# Patient Record
Sex: Female | Born: 1935 | Race: White | Hispanic: No | Smoking: Never smoker
Health system: Southern US, Community
[De-identification: ages and names within clinical notes are randomized; demographics above are authoritative.]

## PROBLEM LIST (undated history)

## (undated) DIAGNOSIS — E049 Nontoxic goiter, unspecified: Secondary | ICD-10-CM

## (undated) DIAGNOSIS — F0390 Unspecified dementia without behavioral disturbance: Secondary | ICD-10-CM

## (undated) DIAGNOSIS — R011 Cardiac murmur, unspecified: Secondary | ICD-10-CM

## (undated) DIAGNOSIS — E785 Hyperlipidemia, unspecified: Secondary | ICD-10-CM

## (undated) DIAGNOSIS — E039 Hypothyroidism, unspecified: Secondary | ICD-10-CM

## (undated) DIAGNOSIS — R269 Unspecified abnormalities of gait and mobility: Secondary | ICD-10-CM

## (undated) DIAGNOSIS — I4510 Unspecified right bundle-branch block: Secondary | ICD-10-CM

## (undated) DIAGNOSIS — I10 Essential (primary) hypertension: Secondary | ICD-10-CM

## (undated) DIAGNOSIS — I4891 Unspecified atrial fibrillation: Secondary | ICD-10-CM

## (undated) DIAGNOSIS — I499 Cardiac arrhythmia, unspecified: Secondary | ICD-10-CM

## (undated) DIAGNOSIS — F419 Anxiety disorder, unspecified: Secondary | ICD-10-CM

## (undated) DIAGNOSIS — I441 Atrioventricular block, second degree: Secondary | ICD-10-CM

## (undated) HISTORY — PX: ABDOMINAL HYSTERECTOMY: SHX81

## (undated) HISTORY — DX: Nontoxic goiter, unspecified: E04.9

## (undated) HISTORY — DX: Anxiety disorder, unspecified: F41.9

## (undated) HISTORY — DX: Unspecified right bundle-branch block: I45.10

## (undated) HISTORY — DX: Unspecified abnormalities of gait and mobility: R26.9

## (undated) HISTORY — DX: Atrioventricular block, second degree: I44.1

## (undated) HISTORY — PX: PACEMAKER INSERTION: SHX728

## (undated) HISTORY — PX: OTHER SURGICAL HISTORY: SHX169

## (undated) HISTORY — DX: Hyperlipidemia, unspecified: E78.5

## (undated) HISTORY — DX: Essential (primary) hypertension: I10

## (undated) HISTORY — DX: Unspecified atrial fibrillation: I48.91

## (undated) HISTORY — PX: INSERT / REPLACE / REMOVE PACEMAKER: SUR710

## (undated) HISTORY — DX: Cardiac murmur, unspecified: R01.1

---

## 1997-11-17 ENCOUNTER — Other Ambulatory Visit: Admission: RE | Admit: 1997-11-17 | Discharge: 1997-11-17 | Payer: Self-pay | Admitting: Family Medicine

## 2004-06-27 ENCOUNTER — Encounter (HOSPITAL_COMMUNITY): Admission: RE | Admit: 2004-06-27 | Discharge: 2004-09-25 | Payer: Self-pay | Admitting: Endocrinology

## 2005-02-18 ENCOUNTER — Encounter (HOSPITAL_COMMUNITY): Admission: RE | Admit: 2005-02-18 | Discharge: 2005-05-19 | Payer: Self-pay | Admitting: Endocrinology

## 2006-04-23 ENCOUNTER — Encounter: Admission: RE | Admit: 2006-04-23 | Discharge: 2006-04-23 | Payer: Self-pay | Admitting: Endocrinology

## 2006-09-22 ENCOUNTER — Emergency Department (HOSPITAL_COMMUNITY): Admission: EM | Admit: 2006-09-22 | Discharge: 2006-09-22 | Payer: Self-pay | Admitting: Emergency Medicine

## 2007-06-11 ENCOUNTER — Ambulatory Visit (HOSPITAL_COMMUNITY): Admission: RE | Admit: 2007-06-11 | Discharge: 2007-06-12 | Payer: Self-pay | Admitting: Surgery

## 2007-06-11 ENCOUNTER — Encounter (INDEPENDENT_AMBULATORY_CARE_PROVIDER_SITE_OTHER): Payer: Self-pay | Admitting: Surgery

## 2007-06-30 ENCOUNTER — Encounter: Admission: RE | Admit: 2007-06-30 | Discharge: 2007-06-30 | Payer: Self-pay | Admitting: Surgery

## 2008-04-06 ENCOUNTER — Inpatient Hospital Stay (HOSPITAL_COMMUNITY): Admission: EM | Admit: 2008-04-06 | Discharge: 2008-04-07 | Payer: Self-pay | Admitting: Emergency Medicine

## 2008-05-12 DIAGNOSIS — I441 Atrioventricular block, second degree: Secondary | ICD-10-CM

## 2008-05-12 HISTORY — DX: Atrioventricular block, second degree: I44.1

## 2008-05-23 ENCOUNTER — Encounter: Admission: RE | Admit: 2008-05-23 | Discharge: 2008-05-23 | Payer: Self-pay | Admitting: Cardiology

## 2008-05-25 ENCOUNTER — Inpatient Hospital Stay (HOSPITAL_COMMUNITY): Admission: AD | Admit: 2008-05-25 | Discharge: 2008-05-28 | Payer: Self-pay | Admitting: Cardiology

## 2008-06-13 ENCOUNTER — Emergency Department (HOSPITAL_COMMUNITY): Admission: EM | Admit: 2008-06-13 | Discharge: 2008-06-13 | Payer: Self-pay | Admitting: Emergency Medicine

## 2008-08-04 ENCOUNTER — Encounter: Admission: RE | Admit: 2008-08-04 | Discharge: 2008-11-02 | Payer: Self-pay | Admitting: Neurology

## 2008-11-25 IMAGING — CT CT CHEST W/ CM
2 of 4 series · 15 of 36 positions shown, 18 images · IV contrast (agent unspecified)
Comparison: [REDACTED] chest x-ray 06/11/2007

CLINICAL DATA: Findings suspicious for healing right rib fractures
Thyroid goiter postop.

CT CHEST WITH CONTRAST
TECHNIQUE: Multidetector CT imaging of the chest was performed
following the standard protocol during bolus administration of
intravenous contrast.
Contrast: 75 ml Kmnipaque-RXX.

[Series 3: routine chest · axial · 0.70mm/px · z∈[-250,-6]mm · 12 of 59 slices shown, 15 images]
[im 5/59  mediastinal]
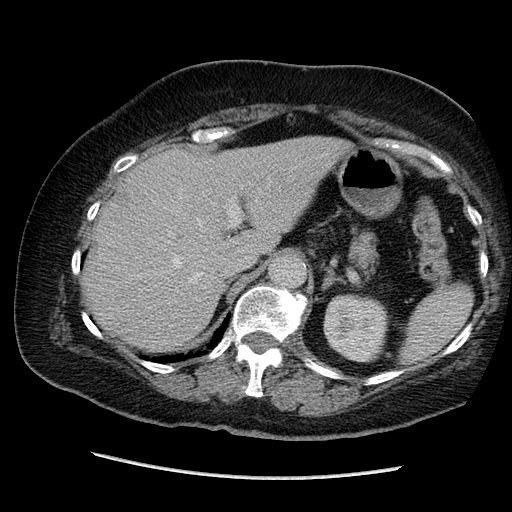
[im 5/59  lung]
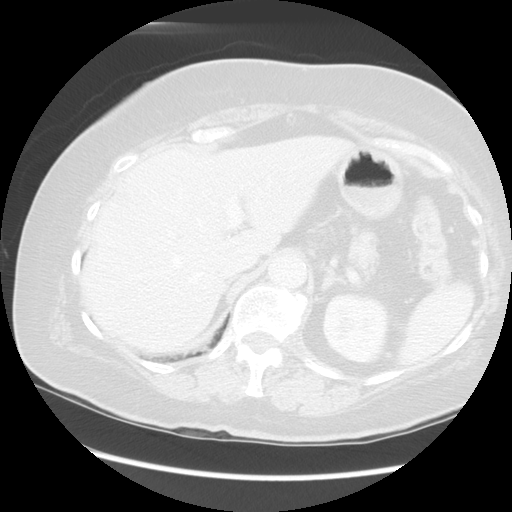
[im 9/59  lung]
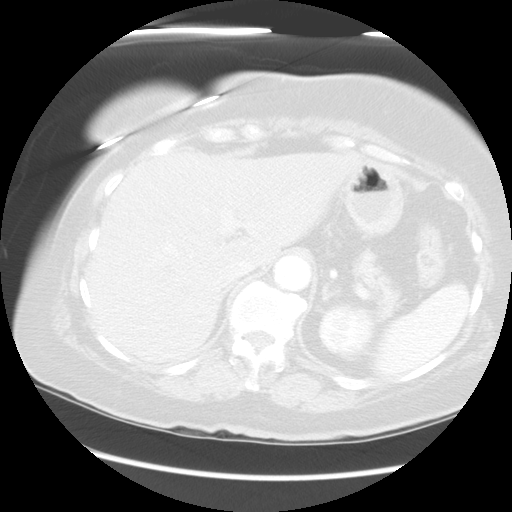
[im 13/59  lung]
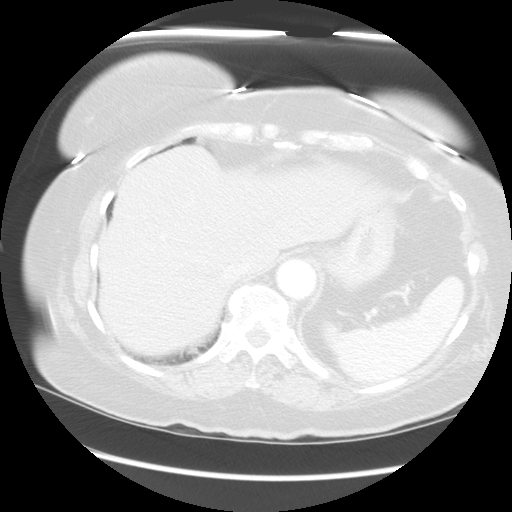
[im 17/59  lung]
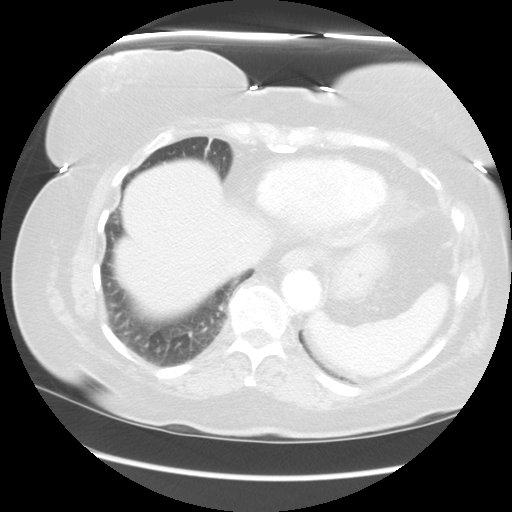
[im 21/59  mediastinal]
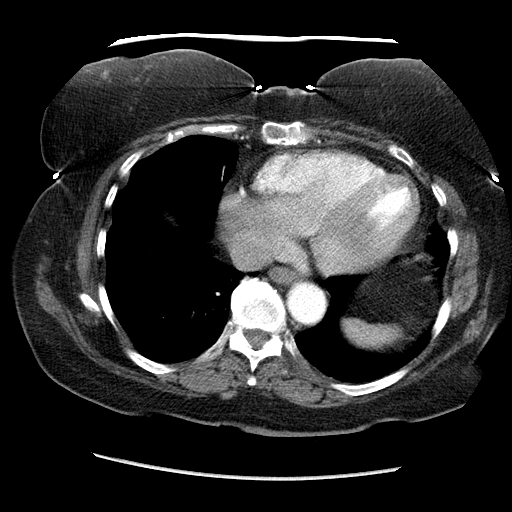
[im 21/59  lung]
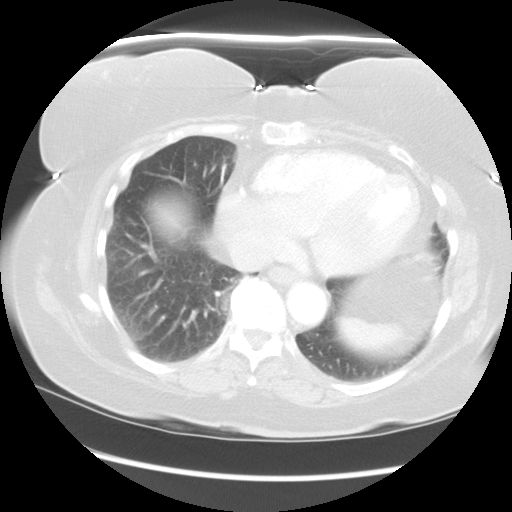
[im 25/59  lung]
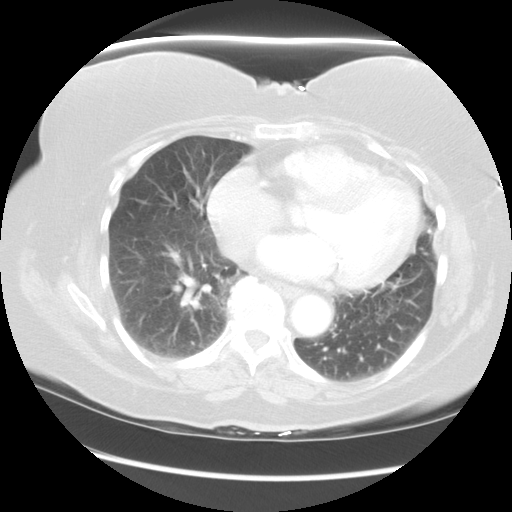
[im 34/59  lung]
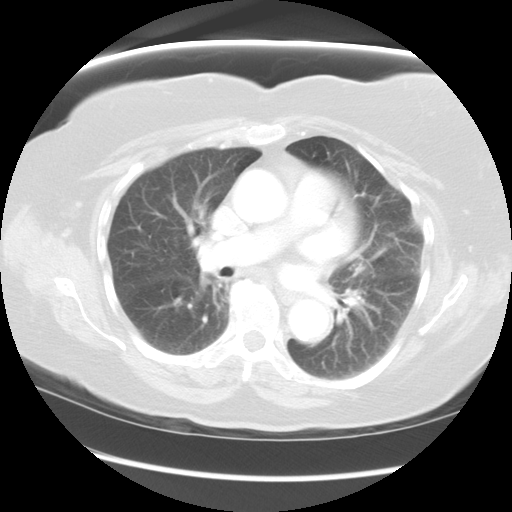
[im 38/59  lung]
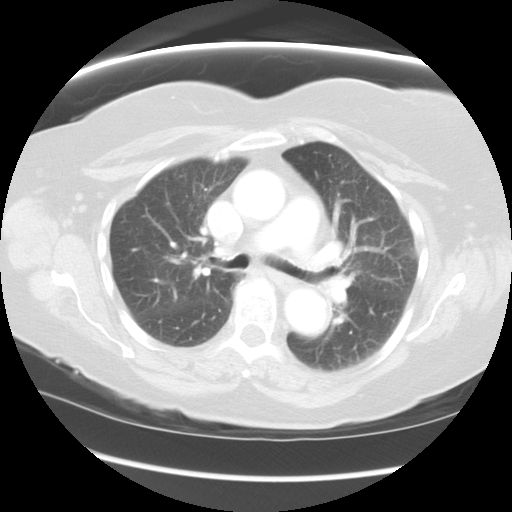
[im 42/59  mediastinal]
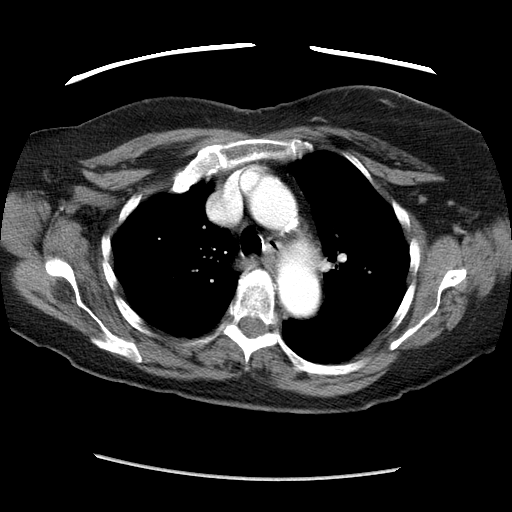
[im 42/59  lung]
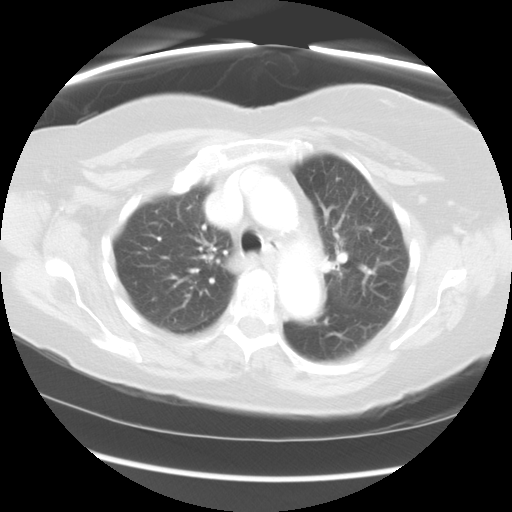
[im 46/59  lung]
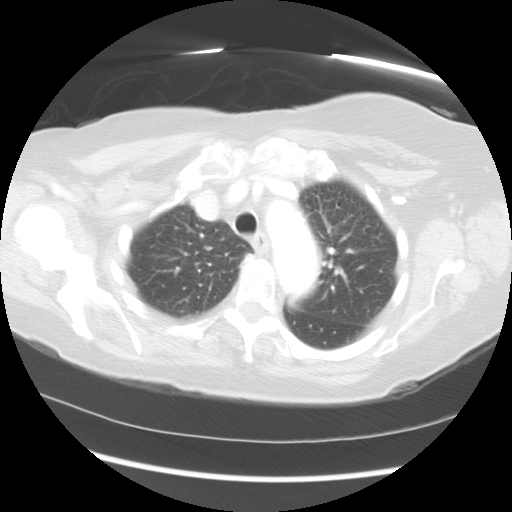
[im 50/59  lung]
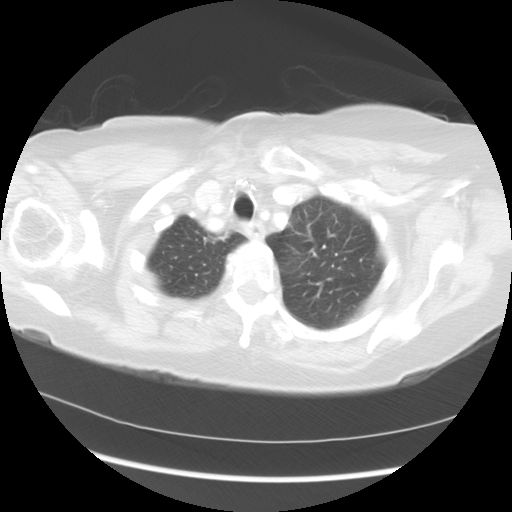
[im 54/59  lung]
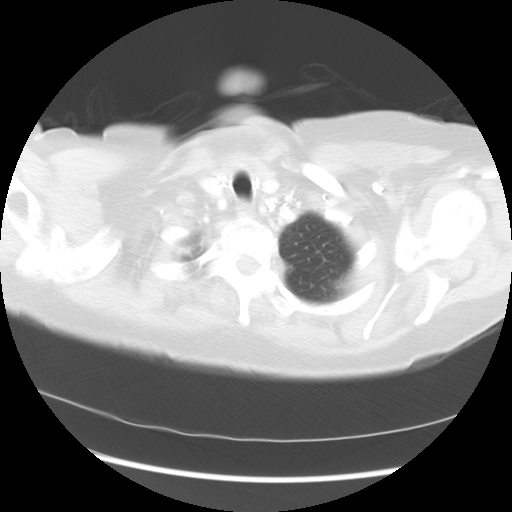

[Series 602: sagittal body · sagittal · 0.70mm/px · 3 of 145 slices shown]
[im 29/145  lung]
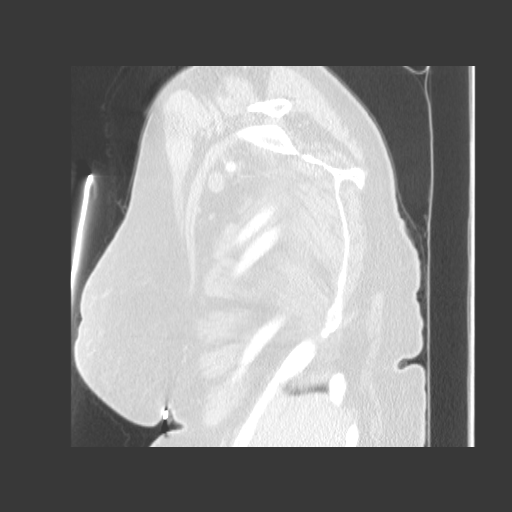
[im 58/145  lung]
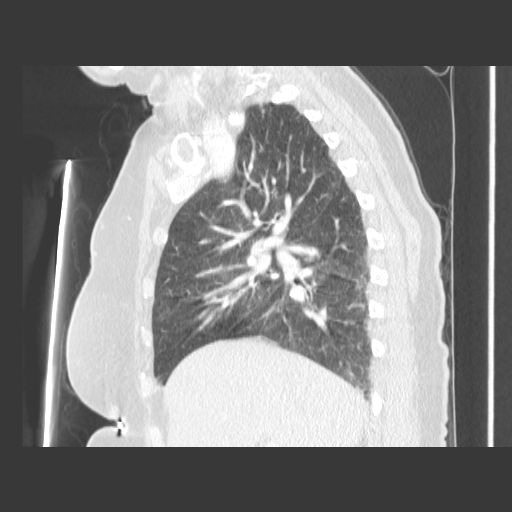
[im 87/145  lung]
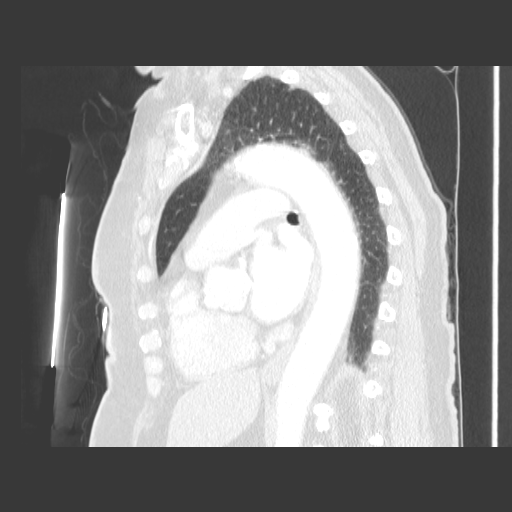

[15 of 36 positions shown; findings below may reference images not displayed]

FINDINGS: Region of chest x-ray findings correspond to benign
appearing subacute or old right anterolateral third through fifth
rib fracture changes.  No pathologic bone lesion is seen by CT.
The patient is post thyroid resection with slight generalized
atheromatous vascular calcification to include coronary arteries.
Stable slight cardiomegaly is seen.  No mediastinal, hilar 1or
axillary mass/adenopathy is seen.  Lungs appear clear with no
pleural abnormality.  Stable slight to moderate degenerative
changes are seen within the thoracic spine and right
acromioclavicular joint.
IMPRESSION: 1.  Chest x-ray findings correspond to subacute to chronic right
anterolateral third through fifth rib fractures with no pathologic
lesions seen.
2.  Slight atheromatous vascular calcification to include coronary
arteries with stable slight cardiomegaly.
3.  Otherwise, no acute findings.

## 2008-11-29 ENCOUNTER — Encounter: Payer: Self-pay | Admitting: Emergency Medicine

## 2008-11-29 ENCOUNTER — Ambulatory Visit: Admission: RE | Admit: 2008-11-29 | Discharge: 2008-11-29 | Payer: Self-pay | Admitting: Emergency Medicine

## 2008-11-29 ENCOUNTER — Ambulatory Visit: Payer: Self-pay | Admitting: Vascular Surgery

## 2009-04-10 ENCOUNTER — Encounter: Admission: RE | Admit: 2009-04-10 | Discharge: 2009-04-10 | Payer: Self-pay | Admitting: Emergency Medicine

## 2009-12-21 ENCOUNTER — Encounter
Admission: RE | Admit: 2009-12-21 | Discharge: 2010-01-29 | Payer: Self-pay | Source: Home / Self Care | Attending: Endocrinology | Admitting: Endocrinology

## 2010-03-03 ENCOUNTER — Encounter: Payer: Self-pay | Admitting: Endocrinology

## 2010-03-04 ENCOUNTER — Encounter: Payer: Self-pay | Admitting: Endocrinology

## 2010-03-04 ENCOUNTER — Encounter: Payer: Self-pay | Admitting: Emergency Medicine

## 2010-03-05 ENCOUNTER — Encounter: Payer: Self-pay | Admitting: Surgery

## 2010-03-05 ENCOUNTER — Encounter: Payer: Self-pay | Admitting: Emergency Medicine

## 2010-05-22 LAB — POCT I-STAT, CHEM 8
Creatinine, Ser: 0.8 mg/dL (ref 0.4–1.2)
Hemoglobin: 13.9 g/dL (ref 12.0–15.0)
Sodium: 140 mEq/L (ref 135–145)
TCO2: 29 mmol/L (ref 0–100)

## 2010-05-22 LAB — URINALYSIS, ROUTINE W REFLEX MICROSCOPIC
Glucose, UA: NEGATIVE mg/dL
Hgb urine dipstick: NEGATIVE
Specific Gravity, Urine: 1.021 (ref 1.005–1.030)

## 2010-05-22 LAB — URINE MICROSCOPIC-ADD ON

## 2010-05-23 LAB — BASIC METABOLIC PANEL
CO2: 31 mEq/L (ref 19–32)
Calcium: 8.5 mg/dL (ref 8.4–10.5)
Calcium: 9.2 mg/dL (ref 8.4–10.5)
Creatinine, Ser: 0.78 mg/dL (ref 0.4–1.2)
GFR calc Af Amer: >60 mL/min (ref >60)
GFR calc Af Amer: >60 mL/min (ref >60)
GFR calc non Af Amer: >60 mL/min (ref >60)
GFR calc non Af Amer: >60 mL/min (ref >60)
Glucose, Bld: 97 mg/dL (ref 70–99)
Glucose, Bld: 119 mg/dL — ABNORMAL HIGH (ref 70–99)
Potassium: 3.8 mEq/L (ref 3.5–5.1)
Sodium: 137 mEq/L (ref 135–145)
Sodium: 141 mEq/L (ref 135–145)

## 2010-05-23 LAB — CBC
HCT: 41 % (ref 36.0–46.0)
Hemoglobin: 12.4 g/dL (ref 12.0–15.0)
Hemoglobin: 13.9 g/dL (ref 12.0–15.0)
MCHC: 33.8 g/dL (ref 30.0–36.0)
RBC: 4.43 MIL/uL (ref 3.87–5.11)
RDW: 13.7 % (ref 11.5–15.5)
RDW: 13.8 % (ref 11.5–15.5)

## 2010-05-23 LAB — LIPID PANEL
Cholesterol: 194 mg/dL (ref 0–200)
HDL: 54 mg/dL (ref >39)
LDL Cholesterol: 112 mg/dL — ABNORMAL HIGH (ref 0–99)
Total CHOL/HDL Ratio: 3.6 RATIO

## 2010-05-23 LAB — MAGNESIUM
Magnesium: 1.9 mg/dL (ref 1.5–2.5)
Magnesium: 2 mg/dL (ref 1.5–2.5)

## 2010-05-23 LAB — PROTIME-INR
INR: 1.6 — ABNORMAL HIGH (ref 0.00–1.49)
Prothrombin Time: 20.2 seconds — ABNORMAL HIGH (ref 11.6–15.2)

## 2010-05-23 LAB — BRAIN NATRIURETIC PEPTIDE: Pro B Natriuretic peptide (BNP): 425 pg/mL — ABNORMAL HIGH (ref 0.0–100.0)

## 2010-05-23 LAB — CARDIAC PANEL(CRET KIN+CKTOT+MB+TROPI)
Relative Index: 2.2 (ref 0.0–2.5)
Relative Index: 2.4 (ref 0.0–2.5)
Troponin I: 0.03 ng/mL (ref 0.00–0.06)

## 2010-05-23 LAB — COMPREHENSIVE METABOLIC PANEL
Alkaline Phosphatase: 66 U/L (ref 39–117)
BUN: 13 mg/dL (ref 6–23)
CO2: 27 mEq/L (ref 19–32)
Chloride: 105 mEq/L (ref 96–112)
Creatinine, Ser: 0.55 mg/dL (ref 0.4–1.2)
GFR calc non Af Amer: >60 mL/min (ref >60)
Glucose, Bld: 96 mg/dL (ref 70–99)
Total Bilirubin: 0.9 mg/dL (ref 0.3–1.2)

## 2010-05-29 LAB — COMPREHENSIVE METABOLIC PANEL
AST: 26 U/L (ref 0–37)
Albumin: 3.5 g/dL (ref 3.5–5.2)
BUN: 17 mg/dL (ref 6–23)
Calcium: 9 mg/dL (ref 8.4–10.5)
Chloride: 105 mEq/L (ref 96–112)
Creatinine, Ser: 0.81 mg/dL (ref 0.4–1.2)
GFR calc Af Amer: >60 mL/min (ref >60)
Total Bilirubin: 0.7 mg/dL (ref 0.3–1.2)
Total Protein: 6.7 g/dL (ref 6.0–8.3)

## 2010-05-29 LAB — CARDIAC PANEL(CRET KIN+CKTOT+MB+TROPI)
CK, MB: 3.2 ng/mL (ref 0.3–4.0)
CK, MB: 3.3 ng/mL (ref 0.3–4.0)
Relative Index: 2.6 — ABNORMAL HIGH (ref 0.0–2.5)
Total CK: 121 U/L (ref 7–177)

## 2010-05-29 LAB — PROTIME-INR
INR: 1 (ref 0.00–1.49)
Prothrombin Time: 13.5 seconds (ref 11.6–15.2)

## 2010-05-29 LAB — CBC
Hemoglobin: 14.1 g/dL (ref 12.0–15.0)
MCHC: 34.4 g/dL (ref 30.0–36.0)
MCV: 93.4 fL (ref 78.0–100.0)
RBC: 4.38 MIL/uL (ref 3.87–5.11)
RDW: 13.4 % (ref 11.5–15.5)

## 2010-05-29 LAB — TROPONIN I: Troponin I: 0.02 ng/mL (ref 0.00–0.06)

## 2010-05-29 LAB — T4, FREE: Free T4: 1.57 ng/dL (ref 0.89–1.80)

## 2010-05-29 LAB — APTT: aPTT: 31 seconds (ref 24–37)

## 2010-05-29 LAB — CK TOTAL AND CKMB (NOT AT ARMC): CK, MB: 4.1 ng/mL — ABNORMAL HIGH (ref 0.3–4.0)

## 2010-05-29 LAB — TSH: TSH: 0.007 u[IU]/mL — ABNORMAL LOW (ref 0.350–4.500)

## 2010-06-26 NOTE — Op Note (Signed)
[name], [name]               ACCOUNT NO.:  [id number]   MEDICAL RECORD NO.:  [id number]          PATIENT TYPE:  AMB   LOCATION:  SDS                          FACILITY:  [facility]   PHYSICIAN:  Velora Heckler, MD      DATE OF BIRTH:  February 26, 1926   DATE OF PROCEDURE:  06/11/2007  DATE OF DISCHARGE:                               OPERATIVE REPORT   PREOPERATIVE DIAGNOSIS:  Multinodular goiter with compressive symptoms.   POSTOPERATIVE DIAGNOSIS:  Multinodular goiter with compressive symptoms.   PROCEDURE:  Completion thyroidectomy.   SURGEON:  Velora Heckler, MD, FACS   ASSISTANT:  Angelia Mould. Derrell Lolling, MD, FACS   ANESTHESIA:  General per Dr. Diamantina Monks.   ESTIMATED BLOOD LOSS:  Minimal.   PREPARATION:  Betadine.   COMPLICATIONS:  None.   INDICATIONS:  The patient is a 77 year old white female from [place],  [place].  She was referred by Dr. Adrian Prince with thyroid  goiter.  The patient has a history of partial thyroidectomy performed in  1976 in [place], [place] for toxic nodule.  The patient has  borderline hyperthyroidism.  The patient was evaluated by Dr. Armanda Magic and cleared for surgery from a cardiac standpoint.  She is now  prepared and brought to the operating room for completion thyroidectomy.   BODY OF REPORT:  Procedure was done in OR #60 at [facility].  The patient was brought to the operating room and placed in a  supine position on the operating room table.  Following administration  of general anesthesia, the patient was positioned and then prepped and  draped in usual strict aseptic fashion.  After ascertaining that an  adequate level of anesthesia been achieved, the previous Kocher incision  was reopened with a #10 blade.  Dissection was carried through  subcutaneous tissues and platysma.  Hemostasis was obtained with  electrocautery.  Skin flaps were elevated cephalad and caudad from the  thyroid notch to the  sternal notch.  A Mahorner self-retaining retractor  was placed for exposure.  Strap muscles were incised in the midline.  Dissection was begun on the left.  Left thyroid lobe was moderately  prominent.  It appears that it contain a single large nodule.  It was  gently dissected out.  Tissue planes were easily developed.  It appears  that previous thyroid surgery has involved nodules within the thyroid  isthmus.  Strap muscles were reflected laterally.  The left lobe was  mobilized gently with a Pension scheme manager.  Superior pole vessels were  dissected out and ligated in continuity with 2-0 silk ties and divided  with a harmonic scalpel.  Parathyroid tissue was identified and  preserved at the superior pole.  Inferior venous tributaries were  divided between Ligaclips with the harmonic scalpel.  Gland was  mobilized and rolled anteriorly.  Recurrent nerve was identified and  preserved.  Branches of the inferior thyroid artery were divided between  small Ligaclips with a harmonic scalpel.  Gland was rolled up and onto  the anterior trachea.  Ligament of Allyson Sabal  was transected with the  electrocautery, and the lobe was mobilized across the midline.  There  was essentially no tissue in the thyroid isthmus.  There was no sign of  pyramidal lobe.   Next, we turned our attention to the right thyroid lobe.  Again, strap  muscles were reflected laterally.  The right lobe was markedly larger  than the left.  It contains at least a single large dominant nodule.  Strap muscles were reflected laterally and lobe was gently dissected out  with the Pension scheme manager.  Again, no evidence of previous resection  was noted in the lateral right lobe.  Middle vein was divided between  medium Ligaclips with the harmonic scalpel.  Superior pole was dissected  out.  Superior pole vessels were ligated in continuity with 2-0 silk  ties and divided with the harmonic scalpel.  Tributaries to the superior  pole were  also divided between Ligaclips with the harmonic scalpel.  Parathyroid tissue was identified and preserved.  Inferior parathyroid  was identified on the right and preserved.  Inferior venous tributaries  were divided between medium Ligaclips with the harmonic scalpel.  Gland  was rolled further anteriorly.  Recurrent nerve was identified and  preserved.  Branches of the inferior thyroid artery were divided between  small Ligaclips with the harmonic scalpel.  Ligament of Allyson Sabal was  transected with electrocautery and the gland was excised off the  anterior trachea.  Sutures used to mark the right superior pole.  The  entire specimen was submitted to pathology for review.  Neck was  irrigated bilaterally with warm saline.  Hemostasis was achieved with  small Ligaclips.  Surgicel was placed in the operative field.  Strap  muscles were reapproximated in the midline with interrupted 3-0 Vicryl  sutures.  Platysma was closed with interrupted 3-0 Vicryl sutures.  Skin  was closed with running 4-0 Monocryl subcuticular suture.  Wound was  washed and dried.  Benzoin and Steri-Strips were applied.  Sterile  dressings were applied.  The patient was awakened from anesthesia and  brought to the recovery room in stable condition.  The patient tolerated  the procedure well.      Velora Heckler, MD  Electronically Signed     TMG/MEDQ  D:  06/11/2007  T:  06/12/2007  Job:  [id number]   cc:   Jeannett Senior A. Evlyn Kanner, M.D.  [place] Cleta Alberts, M.D.  Armanda Magic, M.D.

## 2010-06-26 NOTE — H&P (Signed)
NAMEMABELL, ESGUERRA NO.:  [id number]   MEDICAL RECORD NO.:  [id number]          PATIENT TYPE:  INP   LOCATION:  4707                         FACILITY:  [facility]   PHYSICIAN:  Lyn Records, M.D.   DATE OF BIRTH:  1926-05-30   DATE OF ADMISSION:  04/06/2008  DATE OF DISCHARGE:                              HISTORY & PHYSICAL   CHIEF COMPLAINT:  Atrial fibrillation.   HISTORY OF PRESENT ILLNESS:  [name] is a 77 year old female with a  known history of paroxysmal atrial fibrillation, but is not a Coumadin  candidate secondary to gait disorder and questionable dementia.  She  went to see Dr. Cleta Alberts today for medication refills and was found to be in  AFib with RVR.  She was sent to [facility] for admission.  Interestingly, the patient is asymptomatic.  She denies any chest pain,  palpitations, PND, orthopnea, dizziness, or syncope.   REVIEW OF SYSTEMS:  Otherwise negative.   PAST MEDICAL HISTORY:  She has undergone a thyroidectomy secondary to  goiter, history of hypercholesterolemia, hypertension, right bundle-  branch block, and paroxysmal atrial fibrillation.  She has a fall risk.  She has a history of gait disorder and sees Dr. Vickey Huger.  She has a  questionable history of dementia as well as anxiety.   SOCIAL HISTORY:  She denies any tobacco, alcohol, or illicit drug use.   FAMILY HISTORY:  Father had a history of heart disease.   ALLERGIES:  No known drug allergies.   MEDICATIONS:  1. Synthroid 75 mcg 2 tablets daily.  2. Lasix p.r.n.  3. Potassium 20 mEq 1 p.o. b.i.d.  4. Xanax 0.5 mg 1/2 tablet 1 tablet p.o. b.i.d.  5. Vitamin D.   I have called her pharmacy, [facility] on [address] [phone] and spoke to  the pharmacist.  He states that her last dispensed lisinopril 20 mg a  day and Toprol-XL 25 mg a day was at the end of December and since that  time she has not picked any more of these.  I am concerned that she has  been noncompliant with these  medications and that this is why she has  reverted back into atrial fibrillation.   PHYSICAL EXAMINATION:  VITAL SIGNS:  She is afebrile, pulse is now 85,  respirations 20, and blood pressure 130/70.  GENERAL:  She is anxious.  HEENT: Grossly normal.  No carotid or subclavian bruits.  No JVD or  thyromegaly.  Sclerae clear.  Conjunctivae normal.  Nares without  drainage.  CHEST:  Clear to auscultation bilaterally.  No wheeze or rhonchi.  HEART:  Irregular.  No gross murmur.  ABDOMEN:  Good bowel sounds, nontender, nondistended.  No mass.  No  bruits.  LOWER EXTREMITIES:  No peripheral edema.  SKIN:  Warm and dry.  NEUROLOGIC:  Cranial nerves II-XII grossly intact.  She is anxious.   Chest x-ray pending.  Labs are pending.   Interestingly, since my exam, she has converted back to normal sinus  rhythm.  Her EKG shows normal sinus rhythm rate 85 with a right bundle-  branch block, unchanged.   ASSESSMENT AND PLAN:  1. Atrial fibrillation/flutter, now normal sinus rhythm.  The patient      has been on Toprol in the past, but over the past month, has not      picked up refill of this medication, perhaps this is why she was      going to see Dr. Cleta Alberts today.  I suspect she has been off the Toprol      as well as her lisinopril and this is attributed to her recurrence      of atrial fibrillation.  2. Hypercholesterolemia.  3. Hypertension.  4. Right bundle-branch block.  5. Paroxysmal atrial fibrillation.  6. Question of compliance.  7. Questionable dementia.   We will restart her beta blocker, check her electrolytes, check her  thyroid level, and check her cardiac isoenzymes.  Keep her in the  hospital overnight, and if she is remaining in sinus rhythm, we will  discharge her to home in the morning.       [name], P.A.      Lyn Records, M.D.  Electronically Signed    LB/MEDQ  D:  04/06/2008  T:  04/07/2008  Job:  [id number]   cc:   Armanda Magic, M.D.  Tera Mater.  Evlyn Kanner, M.D.  [place] Cleta Alberts, M.D.

## 2010-06-26 NOTE — Discharge Summary (Signed)
NAMEAYSSA, BENTIVEGNA               ACCOUNT NO.:  [id number]   MEDICAL RECORD NO.:  [id number]          PATIENT TYPE:  INP   LOCATION:  4707                         FACILITY:  [facility]   PHYSICIAN:  Armanda Magic, M.D.     DATE OF BIRTH:  Jun 23, 1926   DATE OF ADMISSION:  04/06/2008  DATE OF DISCHARGE:  04/07/2008                               DISCHARGE SUMMARY   DISCHARGE DIAGNOSES:  1. Paroxysmal atrial fibrillation now normal sinus rhythm.  2. Gait disorder.  3. Early dementia.  4. Status post thyroidectomy secondary to goiter, hyperthyroid now      clinically on replacement therapy.  5. Hypercholesterolemia.  6. Hypertension.  7. Right bundle branch block.  8. Fall risk.  9. Early dementia.  10.Anxiety.   HOSPITAL COURSE:  [name] is a 77 year old female who has known  paroxysmal atrial fibrillation.  She went to see Dr. Cleta Alberts for some  medication refills and was found to be in AFib with RVR.  Interestingly, the patient was asymptomatic.   She was sent over to [facility] for admission and evaluation.  While  in the emergency room, she converted back to normal sinus rhythm.  In  reviewing her medications, she was confused on her medications so I  called her pharmacy and discovered that she has last picked up her  lisinopril 20 mg a day and Toprol-XL 25 mg a day at the end of December  so from my calculation she has not had these medications for at least 2-  3 weeks.  I suspect this is why she went back into her atrial  fibrillation.  The patient admitted to me that she had ran out of some  of her medications.   We restarted her beta blocker.  I did not restart her lisinopril at this  time just would like to see what her blood pressure would do with just  being on metoprolol.   Lab studies showed cardiac enzymes negative x2.  TSH 0.007, but her free  T4 and T3 were normal.  Her white count was 8.1, hemoglobin 14.1,  hematocrit 40.9, platelets 183.  Sodium 139, potassium  4.8, BUN 17,  creatinine 0.81.   DISCHARGE MEDICATIONS:  1. Synthroid 75 mcg 2 tablets daily as before.  2. Lasix p.r.n.  3. Potassium 20 mEq twice a day as before.  4. Xanax twice a day as needed.  5. Vitamin D daily.  6. Toprol-XL 25 mg a day.   I have asked her to hold her lisinopril for now until we see what  happens with her blood pressure on restarting her Toprol.  We may need  to restart the lisinopril at the next office visit.   She is discharged home in stable but improved condition.  She is to  remain on a low-sodium heart-healthy diet.  Increase activity slowly.  Follow up with Dr. Mayford Knife on April 25, 2008 at 4:15 p.m.      Guy Franco, P.A.      Armanda Magic, M.D.  Electronically Signed    LB/MEDQ  D:  04/07/2008  T:  04/07/2008  Job:  [id number]   cc:   Tera Mater. Evlyn Kanner, M.D.  [place] Cleta Alberts, M.D.

## 2010-06-26 NOTE — Discharge Summary (Signed)
[name], [name]               ACCOUNT NO.:  [id number]   MEDICAL RECORD NO.:  [id number]          PATIENT TYPE:  INP   LOCATION:  4707                         FACILITY:  [facility]   PHYSICIAN:  Armanda Magic, M.D.     DATE OF BIRTH:  November 11, 1926   DATE OF ADMISSION:  04/06/2008  DATE OF DISCHARGE:  04/07/2008                               DISCHARGE SUMMARY   DISCHARGE DIAGNOSES:  1. Paroxysmal atrial fibrillation now in normal sinus rhythm.  2. Hypercholesterolemia.  3. Hypertension.  4. Right bundle branch block.  5. Questionable compliance.  6. Early dementia.   HOSPITAL COURSE:  [name] is a 77 year old female who was going to see  Dr. Cleta Strickland at the [facility] for routine refills of her  medications.  At that time, she was found to be in atrial fibrillation  with RVR.  She was sent to [facility] for admission.  Interestingly,  she was asymptomatic.   In further discussions with her, it did not seem clear to me that she  knew her medications so I called her pharmacist.  We figured it out that  her last refills of lisinopril was late December 2009 as well as Toprol-  XL 25 mg a day.  In further discussion with the patient, she was going  to see Dr. Cleta Strickland for refills and we think this was for these medications.  Therefore, she has not been on Toprol for at least several weeks it  appears.   While in the emergency room before we started any type of rate  controlling medications, she converted back to normal sinus rhythm.  We  watched her overnight.  We restarted her beta blocker.   Her labs were essentially normal.  Her cardiac enzymes were negative x2.  Her TSH was 0.007 but her free T4 was 1.57 with a T3 of 3.6.  She tells  me she had just seen Dr. Evlyn Strickland and had these checked 2 weeks ago.  Other  lab work included sodium 139, potassium 4.8, BUN 17, creatinine 0.81,  glucose 91, hemoglobin 14.1, hematocrit 40.9, platelets 183, and white  count 8.1.   She is  being discharged to home in stable but improved condition.  She  is to remain on a low-sodium heart-healthy diet.  Increase activity  slowly.  Follow up with Dr. Mayford Knife on April 25, 2008 at 4:15 p.m.   At this point, I have asked her not to restart her lisinopril just so we  can see what her blood pressure does on the following medications.  1. Synthroid 75 mcg 2 tablets daily.  2. Lasix p.r.n.  3. Potassium 20 mEq one p.o. b.i.d. as before.  4. Xanax twice a day p.r.n.  5. Vitamin D daily.  6. Toprol-XL 25 mg a day.   If her blood pressure is elevated at the next appointment, we will need  to restart her lisinopril but again since she has been off of it for so  long I am afraid to restart both medications at the same time.      Guy Franco, P.A.  Armanda Magic, M.D.  Electronically Signed    LB/MEDQ  D:  04/07/2008  T:  04/07/2008  Job:  [id number]   cc:   [name], M.D.  [name] [name], M.D.

## 2010-06-26 NOTE — Discharge Summary (Signed)
NAMEFANY, CAVANAUGH NO.:  [id number]   MEDICAL RECORD NO.:  [id number]          PATIENT TYPE:  INP   LOCATION:  2508                         FACILITY:  [facility]   PHYSICIAN:  Armanda Magic, M.D.     DATE OF BIRTH:  [birth year]   DATE OF ADMISSION:  05/25/2008  DATE OF DISCHARGE:  05/28/2008                               DISCHARGE SUMMARY   DISCHARGE DIAGNOSES:  1. Tachy-brady syndrome status post dual-chamber pacemaker, [employer],      on May 27, 2008, by Dr. Corliss Marcus.  2. Hypertension.  3. Right bundle-branch block.  4. Paroxysmal atrial fibrillation.  5. Gait disorder.  6. Anxiety.  7. History of dementia.   HOSPITAL COURSE:  Ms. Kreiser is a 77 year old female who has a history of  PAF with RVR.  She was recently placed on the heart monitor, and the  monitor revealed a couple of episodes of second-degree heart block.  She  had some nausea and vomiting with this.  She does have a dementia  however and therefore, is a poor historian.  She does complain of  feeling weak all over.  She was on Coumadin previously but due to her  history of dementia and recent fall, this was stopped.   She was seen in the office due to the findings on the heart monitor and  admitted for tachy-brady syndrome.  She received a [employer] Jude permanent  pacemaker on May 27, 2008, under the care of Dr. Corliss Marcus.  By  May 28, 2008, the patient was ready for discharge to home.   LABORATORY STUDIES:  White count of 8.6, hemoglobin 12.4, hematocrit of  36.2, platelets 213.  Sodium 137, potassium 3.9, BUN 21, creatinine  0.78.  Of note, her TSH was 0.004.  This will need to be followed up on  as an outpatient.   She is to remain on a low-sodium heart-healthy diet.  Activity and wound  care are as per regular instruction sheet.  She is to return to see Dr.  Deitra Mayo, nurse practitioner, on June 07, 2008, at 2:20 p.m.  She is to stop her lisinopril.  Continue potassium 20  mEq twice a day,  Lasix p.r.n., Synthroid 75 mcg p.o. a day, Xanax p.r.n., vitamin D  daily, Toprol-XL 50 mg a day.     Guy Franco, P.A.      Armanda Magic, M.D.  Electronically Signed   LB/MEDQ  D:  08/09/2008  T:  08/10/2008  Job:  [id number]

## 2010-08-02 ENCOUNTER — Other Ambulatory Visit: Payer: Self-pay | Admitting: Neurology

## 2010-08-02 DIAGNOSIS — R269 Unspecified abnormalities of gait and mobility: Secondary | ICD-10-CM

## 2010-08-02 DIAGNOSIS — N3941 Urge incontinence: Secondary | ICD-10-CM

## 2010-08-02 DIAGNOSIS — F0390 Unspecified dementia without behavioral disturbance: Secondary | ICD-10-CM

## 2010-08-03 ENCOUNTER — Ambulatory Visit
Admission: RE | Admit: 2010-08-03 | Discharge: 2010-08-03 | Disposition: A | Discharge status: Home / Self Care | Payer: PRIVATE HEALTH INSURANCE | Source: Ambulatory Visit | Attending: Neurology | Admitting: Neurology

## 2010-08-03 DIAGNOSIS — R269 Unspecified abnormalities of gait and mobility: Secondary | ICD-10-CM

## 2010-08-03 DIAGNOSIS — F0390 Unspecified dementia without behavioral disturbance: Secondary | ICD-10-CM

## 2010-08-03 DIAGNOSIS — N3941 Urge incontinence: Secondary | ICD-10-CM

## 2010-11-06 LAB — CBC
HCT: 40.2
Hemoglobin: 13.7
RDW: 13.9
WBC: 8.3

## 2010-11-06 LAB — URINALYSIS, ROUTINE W REFLEX MICROSCOPIC
Glucose, UA: NEGATIVE
Protein, ur: NEGATIVE
Specific Gravity, Urine: 1.021
Urobilinogen, UA: 0.2

## 2010-11-06 LAB — CALCIUM: Calcium: 8.7

## 2010-11-06 LAB — BASIC METABOLIC PANEL
Calcium: 9.8
GFR calc Af Amer: >60
GFR calc non Af Amer: >60
Glucose, Bld: 91
Potassium: 4.5
Sodium: 143

## 2010-11-06 LAB — DIFFERENTIAL
Basophils Absolute: 0
Eosinophils Relative: 2
Lymphocytes Relative: 38
Lymphs Abs: 3.1
Monocytes Absolute: 0.6
Neutro Abs: 4.3

## 2010-11-06 LAB — PROTIME-INR: Prothrombin Time: 12.8

## 2010-11-26 LAB — COMPREHENSIVE METABOLIC PANEL
ALT: 23
AST: 27
Albumin: 3.6
Alkaline Phosphatase: 50
Chloride: 101
GFR calc Af Amer: >60
Potassium: 3.8
Sodium: 136
Total Protein: 6.7

## 2010-11-26 LAB — URINALYSIS, ROUTINE W REFLEX MICROSCOPIC
Hgb urine dipstick: NEGATIVE
Specific Gravity, Urine: 1.021
Urobilinogen, UA: 0.2
pH: 5.5

## 2010-11-26 LAB — CBC
Platelets: 207
RDW: 12.9
WBC: 7.5

## 2010-11-26 LAB — POCT CARDIAC MARKERS: CKMB, poc: 3.5

## 2010-11-26 LAB — DIFFERENTIAL
Basophils Relative: 1
Eosinophils Absolute: 0.1
Eosinophils Relative: 2
Monocytes Absolute: 0.7
Monocytes Relative: 9
Neutro Abs: 4.9

## 2011-01-23 ENCOUNTER — Emergency Department (HOSPITAL_COMMUNITY): Payer: Medicare Other

## 2011-01-23 ENCOUNTER — Other Ambulatory Visit: Payer: Self-pay

## 2011-01-23 ENCOUNTER — Encounter: Payer: Self-pay | Admitting: *Deleted

## 2011-01-23 ENCOUNTER — Inpatient Hospital Stay (HOSPITAL_COMMUNITY)
Admission: EM | Admit: 2011-01-23 | Discharge: 2011-01-26 | DRG: 312 | Disposition: A | Discharge status: Home / Self Care | Payer: Medicare Other | Attending: Cardiology | Admitting: Cardiology

## 2011-01-23 DIAGNOSIS — R55 Syncope and collapse: Principal | ICD-10-CM | POA: Diagnosis present

## 2011-01-23 DIAGNOSIS — I4891 Unspecified atrial fibrillation: Secondary | ICD-10-CM | POA: Diagnosis present

## 2011-01-23 DIAGNOSIS — F0390 Unspecified dementia without behavioral disturbance: Secondary | ICD-10-CM

## 2011-01-23 DIAGNOSIS — Z950 Presence of cardiac pacemaker: Secondary | ICD-10-CM

## 2011-01-23 HISTORY — DX: Hypothyroidism, unspecified: E03.9

## 2011-01-23 HISTORY — DX: Cardiac arrhythmia, unspecified: I49.9

## 2011-01-23 HISTORY — DX: Unspecified dementia, unspecified severity, without behavioral disturbance, psychotic disturbance, mood disturbance, and anxiety: F03.90

## 2011-01-23 LAB — CARDIAC PANEL(CRET KIN+CKTOT+MB+TROPI)
CK, MB: 4.5 ng/mL — ABNORMAL HIGH (ref 0.3–4.0)
CK, MB: 4.9 ng/mL — ABNORMAL HIGH (ref 0.3–4.0)
Relative Index: 2.8 — ABNORMAL HIGH (ref 0.0–2.5)
Relative Index: 2.3 (ref 0.0–2.5)
Troponin I: <0.3 ng/mL (ref <0.30)
Troponin I: <0.3 ng/mL (ref <0.30)

## 2011-01-23 LAB — CBC
HCT: 39.5 % (ref 36.0–46.0)
HCT: 41.3 % (ref 36.0–46.0)
Hemoglobin: 14.4 g/dL (ref 12.0–15.0)
MCH: 32.3 pg (ref 26.0–34.0)
MCH: 32.4 pg (ref 26.0–34.0)
MCHC: 34.9 g/dL (ref 30.0–36.0)
MCV: 93.2 fL (ref 78.0–100.0)
MCV: 92.8 fL (ref 78.0–100.0)
Platelets: 190 10*3/uL (ref 150–400)
RDW: 14.3 % (ref 11.5–15.5)
WBC: 5.4 10*3/uL (ref 4.0–10.5)

## 2011-01-23 LAB — COMPREHENSIVE METABOLIC PANEL
AST: 20 U/L (ref 0–37)
CO2: 27 mEq/L (ref 19–32)
Calcium: 9.3 mg/dL (ref 8.4–10.5)
Creatinine, Ser: 1.01 mg/dL (ref 0.50–1.10)
GFR calc Af Amer: 59 mL/min — ABNORMAL LOW (ref >90)
GFR calc non Af Amer: 51 mL/min — ABNORMAL LOW (ref >90)
Glucose, Bld: 102 mg/dL — ABNORMAL HIGH (ref 70–99)
Sodium: 142 mEq/L (ref 135–145)
Total Protein: 7.6 g/dL (ref 6.0–8.3)

## 2011-01-23 LAB — DIFFERENTIAL
Basophils Absolute: 0 10*3/uL (ref 0.0–0.1)
Eosinophils Absolute: 0.1 10*3/uL (ref 0.0–0.7)
Eosinophils Relative: 2 % (ref 0–5)
Lymphocytes Relative: 26 % (ref 12–46)
Monocytes Absolute: 0.5 10*3/uL (ref 0.1–1.0)

## 2011-01-23 LAB — HEMOGLOBIN A1C
Hgb A1c MFr Bld: 5.7 % — ABNORMAL HIGH (ref <5.7)
Mean Plasma Glucose: 117 mg/dL — ABNORMAL HIGH (ref <117)

## 2011-01-23 LAB — APTT: aPTT: 35 seconds (ref 24–37)

## 2011-01-23 MED ORDER — NITROGLYCERIN 0.4 MG SL SUBL: 0.4000 mg | SUBLINGUAL_TABLET | SUBLINGUAL | Status: DC | PRN

## 2011-01-23 MED ORDER — LEVOTHYROXINE SODIUM 125 MCG PO TABS
125.0000 ug | ORAL_TABLET | Freq: Every day | ORAL | Status: DC
  Filled 2011-01-23: qty 2

## 2011-01-23 MED ORDER — ASPIRIN EC 81 MG PO TBEC
81.0000 mg | DELAYED_RELEASE_TABLET | Freq: Every day | ORAL | Status: DC
  Administered 2011-01-24: 81 mg via ORAL
  Filled 2011-01-23: qty 1

## 2011-01-23 MED ORDER — POTASSIUM CHLORIDE CRYS ER 20 MEQ PO TBCR
20.0000 meq | EXTENDED_RELEASE_TABLET | Freq: Two times a day (BID) | ORAL | Status: DC
  Administered 2011-01-23 – 2011-01-24 (×2): 20 meq via ORAL
  Filled 2011-01-23 (×3): qty 1

## 2011-01-23 MED ORDER — DONEPEZIL HCL 10 MG PO TABS
10.0000 mg | ORAL_TABLET | Freq: Every day | ORAL | Status: DC
  Administered 2011-01-23 – 2011-01-25 (×3): 10 mg via ORAL
  Filled 2011-01-23 (×4): qty 1

## 2011-01-23 MED ORDER — DULOXETINE HCL 30 MG PO CPEP
30.0000 mg | ORAL_CAPSULE | ORAL | Status: DC
  Administered 2011-01-24 – 2011-01-26 (×3): 30 mg via ORAL
  Filled 2011-01-23 (×5): qty 1

## 2011-01-23 MED ORDER — ALPRAZOLAM 0.25 MG PO TABS
0.2500 mg | ORAL_TABLET | Freq: Two times a day (BID) | ORAL | Status: DC | PRN
  Administered 2011-01-25: 0.25 mg via ORAL
  Filled 2011-01-23: qty 1

## 2011-01-23 MED ORDER — POTASSIUM CHLORIDE IN NACL 20-0.9 MEQ/L-% IV SOLN
INTRAVENOUS | Status: DC
  Administered 2011-01-23: 23:00:00 via INTRAVENOUS
  Filled 2011-01-23: qty 1000

## 2011-01-23 MED ORDER — LEVOTHYROXINE SODIUM 125 MCG PO TABS
125.0000 ug | ORAL_TABLET | ORAL | Status: DC
  Administered 2011-01-24 – 2011-01-25 (×2): 125 ug via ORAL
  Filled 2011-01-23 (×2): qty 1

## 2011-01-23 MED ORDER — ACETAMINOPHEN 325 MG PO TABS: 650.0000 mg | ORAL_TABLET | ORAL | Status: DC | PRN

## 2011-01-23 MED ORDER — HEPARIN SODIUM (PORCINE) 5000 UNIT/ML IJ SOLN
5000.0000 [IU] | Freq: Three times a day (TID) | INTRAMUSCULAR | Status: DC
  Administered 2011-01-23 – 2011-01-26 (×7): 5000 [IU] via SUBCUTANEOUS
  Filled 2011-01-23 (×11): qty 1

## 2011-01-23 MED ORDER — LEVOTHYROXINE SODIUM 125 MCG PO TABS
250.0000 ug | ORAL_TABLET | ORAL | Status: DC
  Administered 2011-01-26: 250 ug via ORAL
  Filled 2011-01-23 (×2): qty 2

## 2011-01-23 MED ORDER — METOPROLOL SUCCINATE ER 50 MG PO TB24
50.0000 mg | ORAL_TABLET | Freq: Every day | ORAL | Status: DC
  Administered 2011-01-23 – 2011-01-25 (×3): 50 mg via ORAL
  Filled 2011-01-23 (×4): qty 1

## 2011-01-23 MED ORDER — VITAMIN D (ERGOCALCIFEROL) 1.25 MG (50000 UNIT) PO CAPS
50000.0000 [IU] | ORAL_CAPSULE | ORAL | Status: DC
  Filled 2011-01-23: qty 1

## 2011-01-23 MED ORDER — VITAMIN D (ERGOCALCIFEROL) 1.25 MG (50000 UNIT) PO CAPS: 50000.0000 [IU] | ORAL_CAPSULE | ORAL | Status: DC

## 2011-01-23 MED ORDER — ONDANSETRON HCL 4 MG/2ML IJ SOLN: 4.0000 mg | Freq: Four times a day (QID) | INTRAMUSCULAR | Status: DC | PRN

## 2011-01-23 NOTE — ED Notes (Signed)
Pt undressed, in gown, on monitor, continuous pulse oximetry and blood pressure cuff; EKG performed; family at bedside 

## 2011-01-23 NOTE — ED Notes (Signed)
Pt does not have medications with her.  Per EMS they are:  Metoprolol, furosemide, cymbalta, synthroid, vitamin D [facility]

## 2011-01-23 NOTE — H&P (Signed)
Admit date: 01/23/2011 Referring Physician [facility] Armanda Magic Chief complaint/reason for admission: passed out  HPI: 77 year old woman with mild dementia who has had a pacemaker placed in the past.  She felt leg swelling yesterday.  She took a Lasix and fell at she urinated quite a bit.  This morning she woke up and went to the kitchen.  She had no warning but suddenly passed out.  Her daughter found her after she was down for about a minute.  The patient states that she has been feeling a little weak for the past month but there've been no specific symptoms.  The daughter reports that the patient was slurring her words initially.  Her right hand was clenched fist as well.  After a couple of minutes, she returned to normal.  Due to this concern for stroke, they came to the emergency room.  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  She has done well since that time for the most part.  Her pacemaker was interrogated when she came to the emergency room today.  There were some episodes of high ventricular rates.  Some minor adjustments were made to the pacemaker.  She has been known to have atrial fibrillation in the past but has not been on Coumadin due to her dementia and falls risk.  She does not recall any palpitations prior to passing out.  She did not feel warm.  She did not feel like she had any warning.  She felt like it was quite sudden.  She has not had any chest pain or shortness of breath.  She did have some mild leg swelling prompting Lasix usage yesterday.   PMH:    Past Medical History  Diagnosis Date  . Dementia     "very mild"    PSH:    Past Surgical History  Procedure Date  . Pacemaker insertion     ALLERGIES:   Shellfish allergy  Prior to Admit Meds:   (Not in a hospital admission) Family HX:   Noncontributory Social HX:    History   Social History  . Marital Status: Widowed    Spouse Name: N/A    Number of  Children: N/A  . Years of Education: N/A   Occupational History  . Not on file.   Social History Main Topics  . Smoking status: Never Smoker   . Smokeless tobacco: Not on file  . Alcohol Use: No  . Drug Use:   . Sexually Active:    Other Topics Concern  . Not on file   Social History Narrative  . No narrative on file     ROS:  All 11 ROS were addressed and are negative except what is stated in the HPI  PHYSICAL EXAM Filed Vitals:   01/23/11 1347  BP: 128/105  Pulse: 69  Temp:   Resp: 16   General: Well developed, well nourished, in no acute distress Head: Eyes PERRLA, No xanthomas.   Normal cephalic and atramatic  Lungs:   Clear bilaterally to auscultation and percussion. Heart:  Occasional premature beats, otherwise HRRR S1 S2 Pulses are 2+ & equal.            No carotid bruit. No JVD.   Abdomen:  abdomen soft and non-tender Msk:   Normal strength and tone for age. Extremities:   No clubbing, cyanosis or edema.  DP +1  Psych:  Good affect, responds appropriately   Labs:  Lab Results  Component Value Date   WBC 5.4 01/23/2011   HGB 13.7 01/23/2011   HCT 39.5 01/23/2011   MCV 93.2 01/23/2011   PLT 190 01/23/2011    Lab 01/23/11 1210  NA 142  K 3.4*  CL 99  CO2 27  BUN 21  CREATININE 1.01  CALCIUM 9.3  PROT 7.6  BILITOT 0.4  ALKPHOS 53  ALT 12  AST 20  GLUCOSE 102*   Lab Results  Component Value Date   CKTOTAL 158 01/23/2011   CKMB 4.5* 01/23/2011   TROPONINI <0.30 01/23/2011   No results found for this basename: PTT   Lab Results  Component Value Date   INR 1.6* 05/25/2008   INR 1.0 04/06/2008   INR 0.9 06/08/2007     Lab Results  Component Value Date   CHOL  Value: 194        ATP III CLASSIFICATION:  <200     mg/dL   Desirable  161-096  mg/dL   Borderline High  >=045    mg/dL   High        05/20/8117   Lab Results  Component Value Date   HDL 54 05/26/2008   Lab Results  Component Value Date   LDLCALC  Value: 112        Total  Cholesterol/HDL:CHD Risk Coronary Heart Disease Risk Table                     Men   Women  1/2 Average Risk   3.4   3.3  Average Risk       5.0   4.4  2 X Average Risk   9.6   7.1  3 X Average Risk  23.4   11.0        Use the calculated Patient Ratio above and the CHD Risk Table to determine the patient's CHD Risk.        ATP III CLASSIFICATION (LDL):  <100     mg/dL   Optimal  147-829  mg/dL   Near or Above                    Optimal  130-159  mg/dL   Borderline  562-130  mg/dL   High  >865     mg/dL   Very High* 7/84/6962   Lab Results  Component Value Date   TRIG 142 05/26/2008   Lab Results  Component Value Date   CHOLHDL 3.6 05/26/2008   No results found for this basename: LDLDIRECT      Radiology:  CT scan of the head showed no acute bleed  EKG:    Normal sinus rhythm, right bundle branch block pattern  ASSESSMENT: Syncope; prior pacemaker placement, dementia  PLAN:  #1) watch the patient on telemetry.  We'll check cardiac enzymes to rule out ischemia.  The most likely etiology for her syncope may be some relative dehydration.  She took Lasix yesterday and had a lot of urine output.  She does not have any swelling today.  She does state that she had some swelling yesterday but she may have overdiuresied somewhat.  Prior Cardiolite in the office showed no ischemia.  No symptoms of ischemia at this time.  #2) hypokalemia:  replace potassium.  #3) dementia: Continue Aricept.  #4) paroxysmal atrial fibrillation: If blood pressure stays stable, consider increasing beta blocker.  She is not a good long-term Coumadin candidate.  Quatavious Rossa S.  01/23/2011  2:29 PM

## 2011-01-23 NOTE — ED Provider Notes (Signed)
History     CSN: [id number] Arrival date & time: 01/23/2011 11:31 AM   First MD Initiated Contact with Patient 01/23/11 1158      Chief Complaint  Patient presents with  . Loss of Consciousness    (Consider location/radiation/quality/duration/timing/severity/associated sxs/prior treatment) HPI Comments: Patient presents from home with episode of loss of consciousness. She r remembers standing in the kitchen and it is not know what happened. Her daughter heard a noise and found her slumped on the floor leaning against a stool. She was initially slow to respond and talking gibberish. She is now alert and oriented and following commands.  She has a mild headache but denies any other injury. She denies any chest pain, shortness of breath, lightheadedness, dizziness, fever, vomiting, abdominal pain.  Does have a pacemaker but is not diabetic.  The history is provided by the patient, the EMS personnel and a relative.    Past Medical History  Diagnosis Date  . Dementia     "very mild"    Past Surgical History  Procedure Date  . Pacemaker insertion     No family history on file.  History  Substance Use Topics  . Smoking status: Never Smoker   . Smokeless tobacco: Not on file  . Alcohol Use: No    OB History    Grav Para Term Preterm Abortions TAB SAB Ect Mult Living                  Review of Systems  Cardiovascular: Positive for syncope.  All other systems reviewed and are negative.    Allergies  Shellfish allergy  Home Medications   Current Outpatient Rx  Name Route Sig Dispense Refill  . DONEPEZIL HCL 10 MG PO TABS Oral Take 10 mg by mouth at bedtime.      . DULOXETINE HCL 30 MG PO CPEP Oral Take 30 mg by mouth every morning.      . FUROSEMIDE 40 MG PO TABS Oral Take 40 mg by mouth daily as needed. For fluid     . LEVOTHYROXINE SODIUM 125 MCG PO TABS Oral Take 125-250 mcg by mouth daily. Take 1 tablet by mouth Monday thru Friday. And take 2 tablets by mouth  Saturday and sunday     . METOPROLOL SUCCINATE ER 50 MG PO TB24 Oral Take 50 mg by mouth daily.      Marland Kitchen POTASSIUM CHLORIDE CRYS CR 20 MEQ PO TBCR Oral Take 20 mEq by mouth 2 (two) times daily.      Marland Kitchen VITAMIN D (ERGOCALCIFEROL) 50000 UNITS PO CAPS Oral Take 50,000 Units by mouth every 7 (seven) days. monday       BP 126/99  Pulse 53  Temp(Src) 97.7 F (36.5 C) (Oral)  Resp 17  SpO2 96%  Physical Exam  Constitutional: She is oriented to person, place, and time. She appears well-developed and well-nourished. No distress.       Jittery and anxious  HENT:  Head: Normocephalic and atraumatic.  Mouth/Throat: Oropharynx is clear and moist. No oropharyngeal exudate.  Eyes: Conjunctivae and EOM are normal. Pupils are equal, round, and reactive to light.  Neck: Normal range of motion. Neck supple.       No C-spine pain, step-off or deformity  Cardiovascular: Normal rate and regular rhythm.   Murmur heard. Pulmonary/Chest: Effort normal and breath sounds normal. No respiratory distress.  Abdominal: Soft. There is no tenderness. There is no rebound and no guarding.  Musculoskeletal: Normal range of motion. She  exhibits no edema and no tenderness.       No tenderness palpation of T. or L-spine  Neurological: She is alert and oriented to person, place, and time. No cranial nerve deficit.       No facial droop, 5 out of 5 strength throughout, no focal deficits, no ataxia finger to nose, normal speech  Skin: Skin is warm.    ED Course  Procedures (including critical care time)  Labs Reviewed  COMPREHENSIVE METABOLIC PANEL - Abnormal; Notable for the following:    Potassium 3.4 (*)    Glucose, Bld 102 (*)    GFR calc non Af Amer 51 (*)    GFR calc Af Amer 59 (*)    All other components within normal limits  CARDIAC PANEL(CRET KIN+CKTOT+MB+TROPI) - Abnormal; Notable for the following:    CK, MB 4.5 (*)    Relative Index 2.8 (*)    All other components within normal limits  CBC    DIFFERENTIAL  MAGNESIUM  URINALYSIS, ROUTINE W REFLEX MICROSCOPIC   Dg Chest 2 View  01/23/2011  *RADIOLOGY REPORT*  Clinical Data: Fall.  Dementia.  CHEST - 2 VIEW  Comparison: 05/28/2008  Findings: Dual lead pacemaker remains in place.  The heart is that the upper limits normal in size.  Vascularity is normal.  Lungs are clear.  No effusions.  Ordinary degenerative changes effect the spine.  IMPRESSION: No active disease.  Dual lead pacemaker.  Original Report Authenticated By: Thomasenia Sales, M.D.   Ct Head Wo Contrast  01/23/2011  *RADIOLOGY REPORT*  Clinical Data: Loss of consciousness.  The fall, hit head.  CT HEAD WITHOUT CONTRAST  Technique:  Contiguous axial images were obtained from the base of the skull through the vertex without contrast.  Comparison: 08/03/2010  Findings: There is atrophy and chronic small vessel disease changes. No acute intracranial abnormality.  Specifically, no hemorrhage, hydrocephalus, mass lesion, acute infarction, or significant intracranial injury.  No acute calvarial abnormality. Rounded soft tissue in the floor the right maxillary sinus. Mastoids are clear.  IMPRESSION: No acute intracranial abnormality.  Atrophy, chronic microvascular disease.  Original Report Authenticated By: Cyndie Chime, M.D.     1. Syncope       MDM  Syncopal episode with loss of consciousness and amnesia to event. She appears to be at baseline mental status now with a nonfocal neuro exam. We'll obtain CT head, labs, EKG and interrogate pacemaker.   Date: 01/23/2011  Rate: 60  Rhythm: normal sinus rhythm  QRS Axis: left  Intervals: normal  ST/T Wave abnormalities: nonspecific ST/T changes  Conduction Disutrbances:right bundle branch block  Narrative Interpretation: some bigeminy  Old EKG Reviewed: changes noted  Syncope with possible vasovagal component given diuretic use.  Many PACs and PVCs on pacer interrrogation.  D/w Dr. Leontine Locket.       Glynn Octave,  MD 01/23/11 [phone]

## 2011-01-23 NOTE — ED Notes (Signed)
Pacemaker interrogation is completed

## 2011-01-23 NOTE — ED Notes (Signed)
Pt also takes aricept.

## 2011-01-23 NOTE — ED Notes (Signed)
Pt lives with her daughter.  Her daughter heard a fall and went to check on her.  She found her unresponsive initially but pt came around, was initially "talking jibberish", speech is now clear.  Pt is more "fidgety" than normal.  Pt has mild dementia at baseline.  Pt is alert and oriented.  Pt's daughter saw her last at 10:15 and then heard the fall at 10:30.  VSS for ems.

## 2011-01-23 NOTE — ED Notes (Signed)
Reported to [employer], the patient had an abrasion on her right arm when pt asked me to remove the blood cuff.

## 2011-01-23 NOTE — ED Notes (Signed)
Pt placed back on monitor, continuous pulse oximetry and blood pressure cuff; family at bedside 

## 2011-01-23 NOTE — ED Notes (Signed)
EKG performed and handed to Rancour, MD

## 2011-01-23 NOTE — Progress Notes (Signed)
Pt admitted to floor from ED due to syncope episode at home pt does not recall what happened. Pt was brought to floor confused with a history of dementia. IV noted in left hand. Abrasion on right upper arm and laceration on right upper arm due to fall at home. Pt has no other skin issues noted. Pt is on tele with NSR and Atrial paced.

## 2011-01-23 NOTE — ED Notes (Signed)
Received bed request orders from admitting MD via telephone at this time

## 2011-01-23 NOTE — ED Notes (Signed)
An abrasion with a small LAC noted on the posterior aspect of her right upper arm.

## 2011-01-24 ENCOUNTER — Encounter (HOSPITAL_COMMUNITY): Payer: Self-pay | Admitting: General Practice

## 2011-01-24 LAB — BASIC METABOLIC PANEL
BUN: 24 mg/dL — ABNORMAL HIGH (ref 6–23)
CO2: 27 mEq/L (ref 19–32)
Chloride: 103 mEq/L (ref 96–112)
Glucose, Bld: 94 mg/dL (ref 70–99)
Potassium: 3.2 mEq/L — ABNORMAL LOW (ref 3.5–5.1)
Sodium: 141 mEq/L (ref 135–145)

## 2011-01-24 LAB — LIPID PANEL
Cholesterol: 265 mg/dL — ABNORMAL HIGH (ref 0–200)
Triglycerides: 163 mg/dL — ABNORMAL HIGH (ref <150)
VLDL: 33 mg/dL (ref 0–40)

## 2011-01-24 LAB — CARDIAC PANEL(CRET KIN+CKTOT+MB+TROPI): Troponin I: <0.3 ng/mL (ref <0.30)

## 2011-01-24 MED ORDER — POTASSIUM CHLORIDE CRYS ER 20 MEQ PO TBCR
40.0000 meq | EXTENDED_RELEASE_TABLET | Freq: Two times a day (BID) | ORAL | Status: AC
  Administered 2011-01-24 – 2011-01-25 (×2): 40 meq via ORAL
  Filled 2011-01-24: qty 2
  Filled 2011-01-24: qty 1

## 2011-01-24 MED ORDER — ASPIRIN 325 MG PO TABS
325.0000 mg | ORAL_TABLET | Freq: Every day | ORAL | Status: DC
  Administered 2011-01-25 – 2011-01-26 (×2): 325 mg via ORAL
  Filled 2011-01-24 (×2): qty 1

## 2011-01-24 NOTE — Progress Notes (Addendum)
Patient Name: [name] Date of Encounter: 01/24/2011    SUBJECTIVE: No recurrence of dizziness or syncope.Spoke to daughter who says when she found the patient in the corner of a room drooling and unable to speak. Over 15 to 20 minutes the speech gradually improved to baseline. No problems today.  TELEMETRY:  Paced: Filed Vitals:   01/23/11 2200 01/24/11 0552 01/24/11 0914 01/24/11 1330  BP: 137/81 138/64 124/67 123/73  Pulse: 70 58 65 67  Temp: 98.6 F (37 C) 98.2 F (36.8 C) 98.6 F (37 C) 98.2 F (36.8 C)  TempSrc: Oral Oral Oral Oral  Resp: 20 20 17 20   SpO2: 96% 100% 96% 94%    Intake/Output Summary (Last 24 hours) at 01/24/11 1528 Last data filed at 01/24/11 1330  Gross per 24 hour  Intake    300 ml  Output      0 ml  Net    300 ml    LABS: Basic Metabolic Panel:  Basename 01/24/11 0628 01/23/11 1843 01/23/11 1210  NA 141 -- 142  K 3.2* -- 3.4*  CL 103 -- 99  CO2 27 -- 27  GLUCOSE 94 -- 102*  BUN 24* -- 21  CREATININE 0.98 0.95 --  CALCIUM 8.6 -- 9.3  MG -- -- 1.7  PHOS -- -- --   CBC:  Basename 01/23/11 1843 01/23/11 1210  WBC 8.4 5.4  NEUTROABS -- 3.5  HGB 14.4 13.7  HCT 41.3 39.5  MCV 92.8 93.2  PLT 212 190   Cardiac Enzymes:  Basename 01/24/11 0051 01/23/11 1843 01/23/11 1206  CKTOTAL 269* 217* 158  CKMB 4.9* 4.9* 4.5*  CKMBINDEX -- -- --  TROPONINI <0.30 <0.30 <0.30    Hemoglobin A1C: Basename 01/23/11 1843  HGBA1C 5.7*   Fasting Lipid Panel:  Basename 01/24/11 0628  CHOL 265*  HDL 61  LDLCALC 171*  TRIG 163*  CHOLHDL 4.3  LDLDIRECT --    Radiology/Studies:  RADIOLOGY REPORT*  Clinical Data: Fall. Dementia.  CHEST - 2 VIEW  Comparison: 05/28/2008  Findings: Dual lead pacemaker remains in place. The heart is that  the upper limits normal in size. Vascularity is normal. Lungs are  clear. No effusions. Ordinary degenerative changes effect the  spine.  IMPRESSION:  No active disease. Dual lead pacemaker.  Original  Report Authenticated By: Thomasenia Sales, M.D.      *RADIOLOGY REPORT*  Clinical Data: Loss of consciousness. The fall, hit head.  CT HEAD WITHOUT CONTRAST  Technique: Contiguous axial images were obtained from the base of  the skull through the vertex without contrast.  Comparison: 08/03/2010  Findings: There is atrophy and chronic small vessel disease  changes. No acute intracranial abnormality. Specifically, no  hemorrhage, hydrocephalus, mass lesion, acute infarction, or  significant intracranial injury. No acute calvarial abnormality.  Rounded soft tissue in the floor the right maxillary sinus.  Mastoids are clear.  IMPRESSION:  No acute intracranial abnormality.  Atrophy, chronic microvascular disease.  Original Report Authenticated By: Cyndie Chime, M.D.        Physical Exam: Blood pressure 123/73, pulse 67, temperature 98.2 F (36.8 C), temperature source Oral, resp. rate 20, SpO2 94.00%. Weight change:    No change from admit. Difficult to obtain reliable history.   ASSESSMENT:  1. Uncertain cause for syncopal episode. R/O neuro etiology such as seizure or TIA  2. Dementia    Plan:  1. Replete potassium  2. Dr. Mayford Knife to see in AM  3. Neuro consult  4. EEG  Signed, Lesleigh Noe 01/24/2011, 3:28 PM

## 2011-01-24 NOTE — Consult Note (Signed)
Reason for Consult: "patient found drooling and unresponsive"  HPI: [name] is an 77 y.o. female. Who has dementia and a pacer for Mobitz type 2 heart block who was found unresponsive and drooling by her daughter in another room. Patient had some confusion and slurred speech for 15 to 20 min. Patient is a poor historian and cannot provide history. The etiology of the fall was not clear. She did did not urinate herself or bite her tongue during this event.   Past Medical History  Diagnosis Date  . Dementia     "very mild"  . Hypothyroidism   . Dysrhythmia    Past Surgical History  Procedure Date  . Pacemaker insertion   . Insert / replace / remove pacemaker   . Throidectomy    History reviewed. No pertinent family history.  Social History:  reports that she has never smoked. She has never used smokeless tobacco. She reports that she does not drink alcohol or use illicit drugs.  Allergies:  Allergies  Allergen Reactions  . Shellfish Allergy    Medications: I have reviewed the patient's current medications.  ROS: as above  Blood pressure 123/76, pulse 69, temperature 98.8 F (37.1 C), temperature source Oral, resp. rate 17, SpO2 95.00%.  Neurological exam: AAO*1. No aphasia.  Was not able to tell me months of the year forwards due to inattentiveness, exhibiting Recall was 0 of 3 after 5 minutes. Followed simple, but not complex commands. Cranial nerves: EOMI, PERRL. Visual fields were full. Sensation to V1 through V3 areas of the face was intact and symmetric throughout. There was no facial asymmetry. Hearing to finger rub was equal and symmetrical bilaterally. Shoulder shrug was 5/5 and symmetric bilaterally. Head rotation was 5/5 bilaterally. There was no dysarthria or palatal deviation. Motor: strength was 5/5 and symmetric throughout. Sensory: was intact throughout to light touch, pinprick. Coordination: finger-to-nose were intact and symmetric bilaterally. Reflexes: were 2+  in upper extremities and 1+ at the knees and 1+ at the ankles. Plantar response was downgoing bilaterally. Gait: There was no ataxia noted.  Results for orders placed during the hospital encounter of 01/23/11 (from the past 48 hour(s))  CARDIAC PANEL(CRET KIN+CKTOT+MB+TROPI)     Status: Abnormal   Collection Time   01/23/11 12:06 PM      Component Value Range Comment   Total CK 158  7 - 177 (U/L)    CK, MB 4.5 (*) 0.3 - 4.0 (ng/mL)    Troponin I <0.30  <0.30 (ng/mL)    Relative Index 2.8 (*) 0.0 - 2.5    CBC     Status: Normal   Collection Time   01/23/11 12:10 PM      Component Value Range Comment   WBC 5.4  4.0 - 10.5 (K/uL)    RBC 4.24  3.87 - 5.11 (MIL/uL)    Hemoglobin 13.7  12.0 - 15.0 (g/dL)    HCT 16.1  09.6 - 04.5 (%)    MCV 93.2  78.0 - 100.0 (fL)    MCH 32.3  26.0 - 34.0 (pg)    MCHC 34.7  30.0 - 36.0 (g/dL)    RDW 40.9  81.1 - 91.4 (%)    Platelets 190  150 - 400 (K/uL)   DIFFERENTIAL     Status: Normal   Collection Time   01/23/11 12:10 PM      Component Value Range Comment   Neutrophils Relative 64  43 - 77 (%)    Neutro Abs  3.5  1.7 - 7.7 (K/uL)    Lymphocytes Relative 26  12 - 46 (%)    Lymphs Abs 1.4  0.7 - 4.0 (K/uL)    Monocytes Relative 8  3 - 12 (%)    Monocytes Absolute 0.5  0.1 - 1.0 (K/uL)    Eosinophils Relative 2  0 - 5 (%)    Eosinophils Absolute 0.1  0.0 - 0.7 (K/uL)    Basophils Relative 0  0 - 1 (%)    Basophils Absolute 0.0  0.0 - 0.1 (K/uL)   COMPREHENSIVE METABOLIC PANEL     Status: Abnormal   Collection Time   01/23/11 12:10 PM      Component Value Range Comment   Sodium 142  135 - 145 (mEq/L)    Potassium 3.4 (*) 3.5 - 5.1 (mEq/L)    Chloride 99  96 - 112 (mEq/L)    CO2 27  19 - 32 (mEq/L)    Glucose, Bld 102 (*) 70 - 99 (mg/dL)    BUN 21  6 - 23 (mg/dL)    Creatinine, Ser 1.61  0.50 - 1.10 (mg/dL)    Calcium 9.3  8.4 - 10.5 (mg/dL)    Total Protein 7.6  6.0 - 8.3 (g/dL)    Albumin 3.8  3.5 - 5.2 (g/dL)    AST 20  0 - 37 (U/L)     ALT 12  0 - 35 (U/L)    Alkaline Phosphatase 53  39 - 117 (U/L)    Total Bilirubin 0.4  0.3 - 1.2 (mg/dL)    GFR calc non Af Amer 51 (*) >90 (mL/min)    GFR calc Af Amer 59 (*) >90 (mL/min)   MAGNESIUM     Status: Normal   Collection Time   01/23/11 12:10 PM      Component Value Range Comment   Magnesium 1.7  1.5 - 2.5 (mg/dL)   CARDIAC PANEL(CRET KIN+CKTOT+MB+TROPI)     Status: Abnormal   Collection Time   01/23/11  6:43 PM      Component Value Range Comment   Total CK 217 (*) 7 - 177 (U/L)    CK, MB 4.9 (*) 0.3 - 4.0 (ng/mL)    Troponin I <0.30  <0.30 (ng/mL)    Relative Index 2.3  0.0 - 2.5    PROTIME-INR     Status: Normal   Collection Time   01/23/11  6:43 PM      Component Value Range Comment   Prothrombin Time 14.0  11.6 - 15.2 (seconds)    INR 1.06  0.00 - 1.49    APTT     Status: Normal   Collection Time   01/23/11  6:43 PM      Component Value Range Comment   aPTT 35  24 - 37 (seconds)   HEMOGLOBIN A1C     Status: Abnormal   Collection Time   01/23/11  6:43 PM      Component Value Range Comment   Hemoglobin A1C 5.7 (*) <5.7 (%)    Mean Plasma Glucose 117 (*) <117 (mg/dL)   CBC     Status: Normal   Collection Time   01/23/11  6:43 PM      Component Value Range Comment   WBC 8.4  4.0 - 10.5 (K/uL)    RBC 4.45  3.87 - 5.11 (MIL/uL)    Hemoglobin 14.4  12.0 - 15.0 (g/dL)    HCT 09.6  04.5 - 40.9 (%)  MCV 92.8  78.0 - 100.0 (fL)    MCH 32.4  26.0 - 34.0 (pg)    MCHC 34.9  30.0 - 36.0 (g/dL)    RDW 16.1  09.6 - 04.5 (%)    Platelets 212  150 - 400 (K/uL)   CREATININE, SERUM     Status: Abnormal   Collection Time   01/23/11  6:43 PM      Component Value Range Comment   Creatinine, Ser 0.95  0.50 - 1.10 (mg/dL)    GFR calc non Af Amer 55 (*) >90 (mL/min)    GFR calc Af Amer 63 (*) >90 (mL/min)   CARDIAC PANEL(CRET KIN+CKTOT+MB+TROPI)     Status: Abnormal   Collection Time   01/24/11 12:51 AM      Component Value Range Comment   Total CK 269 (*) 7 - 177  (U/L)    CK, MB 4.9 (*) 0.3 - 4.0 (ng/mL)    Troponin I <0.30  <0.30 (ng/mL)    Relative Index 1.8  0.0 - 2.5    LIPID PANEL     Status: Abnormal   Collection Time   01/24/11  6:28 AM      Component Value Range Comment   Cholesterol 265 (*) 0 - 200 (mg/dL)    Triglycerides 409 (*) <150 (mg/dL)    HDL 61  >81 (mg/dL)    Total CHOL/HDL Ratio 4.3      VLDL 33  0 - 40 (mg/dL)    LDL Cholesterol 191 (*) 0 - 99 (mg/dL)   BASIC METABOLIC PANEL     Status: Abnormal   Collection Time   01/24/11  6:28 AM      Component Value Range Comment   Sodium 141  135 - 145 (mEq/L)    Potassium 3.2 (*) 3.5 - 5.1 (mEq/L)    Chloride 103  96 - 112 (mEq/L)    CO2 27  19 - 32 (mEq/L)    Glucose, Bld 94  70 - 99 (mg/dL)    BUN 24 (*) 6 - 23 (mg/dL)    Creatinine, Ser 4.78  0.50 - 1.10 (mg/dL)    Calcium 8.6  8.4 - 10.5 (mg/dL)    GFR calc non Af Amer 53 (*) >90 (mL/min)    GFR calc Af Amer 61 (*) >90 (mL/min)    Dg Chest 2 View  01/23/2011  *RADIOLOGY REPORT*  Clinical Data: Fall.  Dementia.  CHEST - 2 VIEW  Comparison: 05/28/2008  Findings: Dual lead pacemaker remains in place.  The heart is that the upper limits normal in size.  Vascularity is normal.  Lungs are clear.  No effusions.  Ordinary degenerative changes effect the spine.  IMPRESSION: No active disease.  Dual lead pacemaker.  Original Report Authenticated By: Thomasenia Sales, M.D.   Ct Head Wo Contrast  01/23/2011  *RADIOLOGY REPORT*  Clinical Data: Loss of consciousness.  The fall, hit head.  CT HEAD WITHOUT CONTRAST  Technique:  Contiguous axial images were obtained from the base of the skull through the vertex without contrast.  Comparison: 08/03/2010  Findings: There is atrophy and chronic small vessel disease changes. No acute intracranial abnormality.  Specifically, no hemorrhage, hydrocephalus, mass lesion, acute infarction, or significant intracranial injury.  No acute calvarial abnormality. Rounded soft tissue in the floor the right  maxillary sinus. Mastoids are clear.  IMPRESSION: No acute intracranial abnormality.  Atrophy, chronic microvascular disease.  Original Report Authenticated By: Cyndie Chime, M.D.   Assessment/Plan: 77 years old woman  with episode of LOC that was unwitnessed - patient has an extensive cardiac history with pacer for Mobitz type 2 and A-fib for which she was not on Coumadin due to dementia and falls. Likely syncopal event. Can get EEG, but will not likely be helpful even if she had a seizure and 1st episode would not be treated unless an obvious abnormality was found on an EEG. Doubtful this was a TIA. 1) EEG 2) Change ASA to 325, which is a little better than baby in A-fib prophylaxis 3) Will follow  Kaelin Bonelli 01/24/2011, 6:14 PM

## 2011-01-25 ENCOUNTER — Inpatient Hospital Stay (HOSPITAL_COMMUNITY): Payer: Medicare Other

## 2011-01-25 LAB — BASIC METABOLIC PANEL
BUN: 22 mg/dL (ref 6–23)
CO2: 26 mEq/L (ref 19–32)
Chloride: 106 mEq/L (ref 96–112)
Creatinine, Ser: 0.88 mg/dL (ref 0.50–1.10)
Potassium: 4.2 mEq/L (ref 3.5–5.1)

## 2011-01-25 MED ORDER — ALPRAZOLAM 0.5 MG PO TABS
0.5000 mg | ORAL_TABLET | Freq: Once | ORAL | Status: AC
  Administered 2011-01-25: 0.5 mg via ORAL
  Filled 2011-01-25: qty 1

## 2011-01-25 NOTE — Progress Notes (Signed)
01/25/2011 patient d-dimer was 1.22, notified Dr Donato Schultz, he is aware at 1955. Gloriajean Dell RN

## 2011-01-25 NOTE — Progress Notes (Signed)
*  PRELIMINARY RESULTS* EEG has been performed.  Markus Jarvis 01/25/2011, 9:44 AM

## 2011-01-25 NOTE — Progress Notes (Signed)
Physical Therapy Evaluation Patient Details Name: [name] MRN: [id number] DOB: February 21, 1926 Today's Date: 01/25/2011  Problem List:  Patient Active Problem List  Diagnoses  . Dementia  . Pacemaker    Past Medical History:  Past Medical History  Diagnosis Date  . Dementia     "very mild"  . Hypothyroidism   . Dysrhythmia    Past Surgical History:  Past Surgical History  Procedure Date  . Pacemaker insertion   . Insert / replace / remove pacemaker   . Throidectomy     PT Assessment/Plan/Recommendation PT Assessment Clinical Impression Statement: Pt is independent to modified independent with all mobility.  No further acute or follow-up PT needs.   PT Recommendation/Assessment: Patent does not need any further PT services PT Therapy Diagnosis : Altered mental status No Skilled PT: Patient at baseline level of functioning PT Recommendation Follow Up Recommendations: 24 hour supervision/assistance Equipment Recommended: None recommended by PT PT Goals  Acute Rehab PT Goals PT Goal Formulation: With patient  PT Evaluation Precautions/Restrictions  Precautions Precautions: Fall Restrictions Weight Bearing Restrictions: No Prior Functioning  Home Living Lives With: Daughter Receives Help From: Family Type of Home: House Home Layout: Two level;1/2 bath on main level Alternate Level Stairs-Rails: Can reach both Alternate Level Stairs-Number of Steps: 12 Home Access: Stairs to enter Entrance Stairs-Rails: Left Entrance Stairs-Number of Steps: 4 Bathroom Shower/Tub: Forensic scientist: Standard Bathroom Accessibility: Yes How Accessible: Accessible via walker Home Adaptive Equipment: Walker - rolling Prior Function Level of Independence: Independent with gait;Independent with transfers;Requires assistive device for independence;Needs assistance with homemaking Able to Take Stairs?: Yes Driving: No Vocation: Retired Leisure:  Hobbies-no Cognition Cognition Arousal/Alertness: Awake/alert Overall Cognitive Status: Appears within functional limits for tasks assessed Orientation Level: Oriented to person;Oriented to place;Oriented to situation Sensation/Coordination Sensation Light Touch: Appears Intact Stereognosis: Not tested Hot/Cold: Not tested Proprioception: Not tested Coordination Gross Motor Movements are Fluid and Coordinated: Yes Fine Motor Movements are Fluid and Coordinated: Yes Extremity Assessment RUE Assessment RUE Assessment: Within Functional Limits LUE Assessment LUE Assessment: Within Functional Limits RLE Assessment RLE Assessment: Within Functional Limits LLE Assessment LLE Assessment: Within Functional Limits Mobility (including Balance) Bed Mobility Bed Mobility: Yes Supine to Sit: 7: Independent Sit to Supine - Left: 7: Independent Transfers Transfers: Yes Sit to Stand: 6: Modified independent (Device/Increase time);From bed Stand to Sit: 5: Supervision;With upper extremity assist;To bed Ambulation/Gait Ambulation/Gait: Yes Ambulation/Gait Assistance: 6: Modified independent (Device/Increase time) Ambulation Distance (Feet): 150 Feet Assistive device: Rolling walker Gait Pattern: Decreased stride length Gait velocity: WFL Stairs: No Wheelchair Mobility Wheelchair Mobility: No  Posture/Postural Control Posture/Postural Control: No significant limitations Balance Balance Assessed: No Exercise    End of Session PT - End of Session Equipment Utilized During Treatment: Gait belt Activity Tolerance: Patient tolerated treatment well Patient left: in bed Nurse Communication: Mobility status for transfers;Mobility status for ambulation General Behavior During Session: [facility] for tasks performed Cognition: Impaired, at baseline  Shaunte Weissinger L. Kajuan Guyton DPT [phone] Alferd Apa 01/25/2011, 1:22 PM

## 2011-01-25 NOTE — Progress Notes (Addendum)
SUBJECTIVE:  No complaints today.  Denies any recent history of dizziness, palpitations, chest pain or SOB  OBJECTIVE:   Vitals:   Filed Vitals:   01/24/11 1650 01/24/11 1652 01/24/11 2142 01/25/11 0500  BP: 117/75 123/76 147/78 105/65  Pulse: 73 69 63 66  Temp:   97.9 F (36.6 C) 97.9 F (36.6 C)  TempSrc:   Oral Oral  Resp:   18 18  Weight:   81.6 kg (179 lb 14.3 oz)   SpO2:   94% 96%   I&O's:   Intake/Output Summary (Last 24 hours) at 01/25/11 1059 Last data filed at 01/24/11 1700  Gross per 24 hour  Intake    180 ml  Output      0 ml  Net    180 ml   TELEMETRY: Reviewed telemetry pt in a paced with occasional PVC's     PHYSICAL EXAM General: Well developed, well nourished, in no acute distress Head: Eyes PERRLA, No xanthomas.   Normal cephalic and atramatic  Lungs:   Clear bilaterally to auscultation and percussion. Heart:   HRRR S1 S2 Pulses are 2+ & equal. 1/6 SM at RUSB            No carotid bruit. No JVD.  No abdominal bruits. No femoral bruits. Abdomen: Bowel sounds are positive, abdomen soft and non-tender without masses  Extremities:   No clubbing, cyanosis or edema.  DP +1 Neuro: Alert and oriented X 3. Psych:  Good affect, responds appropriately   LABS: Basic Metabolic Panel:  Basename 01/25/11 0615 01/24/11 0628 01/23/11 1210  NA 140 141 --  K 4.2 3.2* --  CL 106 103 --  CO2 26 27 --  GLUCOSE 88 94 --  BUN 22 24* --  CREATININE 0.88 0.98 --  CALCIUM 8.8 8.6 --  MG -- -- 1.7  PHOS -- -- --   Liver Function Tests:  Basename 01/23/11 1210  AST 20  ALT 12  ALKPHOS 53  BILITOT 0.4  PROT 7.6  ALBUMIN 3.8    CBC:  Basename 01/23/11 1843 01/23/11 1210  WBC 8.4 5.4  NEUTROABS -- 3.5  HGB 14.4 13.7  HCT 41.3 39.5  MCV 92.8 93.2  PLT 212 190   Cardiac Enzymes:  Basename 01/24/11 0051 01/23/11 1843 01/23/11 1206  CKTOTAL 269* 217* 158  CKMB 4.9* 4.9* 4.5*  CKMBINDEX -- -- --  TROPONINI <0.30 <0.30 <0.30   Hemoglobin  A1C:  Basename 01/23/11 1843  HGBA1C 5.7*   Fasting Lipid Panel:  Basename 01/24/11 0628  CHOL 265*  HDL 61  LDLCALC 171*  TRIG 163*  CHOLHDL 4.3  LDLDIRECT --   TCoag Panel:   Lab Results  Component Value Date   INR 1.06 01/23/2011   INR 1.6* 05/25/2008   INR 1.0 04/06/2008    RADIOLOGY: Dg Chest 2 View  01/23/2011  *RADIOLOGY REPORT*  Clinical Data: Fall.  Dementia.  CHEST - 2 VIEW  Comparison: 05/28/2008  Findings: Dual lead pacemaker remains in place.  The heart is that the upper limits normal in size.  Vascularity is normal.  Lungs are clear.  No effusions.  Ordinary degenerative changes effect the spine.  IMPRESSION: No active disease.  Dual lead pacemaker.  Original Report Authenticated By: Thomasenia Sales, M.D.   Ct Head Wo Contrast  01/23/2011  *RADIOLOGY REPORT*  Clinical Data: Loss of consciousness.  The fall, hit head.  CT HEAD WITHOUT CONTRAST  Technique:  Contiguous axial images were obtained from the base  of the skull through the vertex without contrast.  Comparison: 08/03/2010  Findings: There is atrophy and chronic small vessel disease changes. No acute intracranial abnormality.  Specifically, no hemorrhage, hydrocephalus, mass lesion, acute infarction, or significant intracranial injury.  No acute calvarial abnormality. Rounded soft tissue in the floor the right maxillary sinus. Mastoids are clear.  IMPRESSION: No acute intracranial abnormality.  Atrophy, chronic microvascular disease.  Original Report Authenticated By: Cyndie Chime, M.D.      ASSESSMENT:  1.  Syncope of ? Etiology - most likely due to orthostasis from taking Lasix.  She says her daughter thinks she may have taken too many Lasix pills.  No significant drop in BP on document orthostatic check in hospital 2.  PAF not a coumadin candidate due to fall risk 3.  Second degree heart block type II s/p PPM 2010 - no abnormalities noted on pacer check this admission 4.  Heart murmur  PLAN:   1.  2D  echo to assess heart murmur 2.  Check DDimer  Quintella Reichert, MD  01/25/2011  10:59 AM

## 2011-01-25 NOTE — Progress Notes (Signed)
Subjective: Pt is lying in bed when I enter, she has occasional delusional thinking. She is unsure why she is in the hospital and is not aware she is in a hospital until I tell her. She is able to remember this throughout our conversation. She denies pain, weakness, numbness, nausea, vomiting, diarrhea, SOB.  Objective: Vital signs in last 24 hours: Filed Vitals:   01/24/11 1650 01/24/11 1652 01/24/11 2142 01/25/11 0500  BP: 117/75 123/76 147/78 105/65  Pulse: 73 69 63 66  Temp:   97.9 F (36.6 C) 97.9 F (36.6 C)  TempSrc:   Oral Oral  Resp:   18 18  Weight:   179 lb 14.3 oz (81.6 kg)   SpO2:   94% 96%   Weight change:   Intake/Output Summary (Last 24 hours) at 01/25/11 1025 Last data filed at 01/24/11 1700  Gross per 24 hour  Intake    180 ml  Output      0 ml  Net    180 ml   Physical Exam: Neurologic Examination:  Mental Status:  Patient does respond to verbal stimuli, and is able to follow simple commands. She knows her name but is unable to say where she is or the season. She knows the correct year. Speech is fluid and not aphasic.    Cranial Nerves:  II: vision intact, pupils equal, round, reactive to light and accomodation. III,IV,VI: EOM intact  V,VII: facial muscles intact  VIII: hearing intact  IX,X: gag reflex present, XI: trapezius strength normal bilaterally  XII: tongue strength normal no deviation Motor:  Moves all 4 extremities spontaneously 5/5 strength upper and lower extremity bilaterally Sensory:  Sensation intact and equal bilaterally face, arms, legs. Deep Tendon Reflexes:  2+ throughout Plantars:  downgoing bilaterally Cerebellar:  Finger to nose and heel to shin normal  Lab Results: Basic Metabolic Panel:  Lab 01/25/11 1610 01/24/11 0628 01/23/11 1210  NA 140 141 --  K 4.2 3.2* --  CL 106 103 --  CO2 26 27 --  GLUCOSE 88 94 --  BUN 22 24* --  CREATININE 0.88 0.98 --  CALCIUM 8.8 8.6 --  MG -- -- 1.7  PHOS -- -- --   Liver Function  Tests:  Lab 01/23/11 1210  AST 20  ALT 12  ALKPHOS 53  BILITOT 0.4  PROT 7.6  ALBUMIN 3.8  CBC:  Lab 01/23/11 1843 01/23/11 1210  WBC 8.4 5.4  NEUTROABS -- 3.5  HGB 14.4 13.7  HCT 41.3 39.5  MCV 92.8 93.2  PLT 212 190   Cardiac Enzymes:  Lab 01/24/11 0051 01/23/11 1843 01/23/11 1206  CKTOTAL 269* 217* 158  CKMB 4.9* 4.9* 4.5*  CKMBINDEX -- -- --  TROPONINI <0.30 <0.30 <0.30  Hemoglobin A1C:  Lab 01/23/11 1843  HGBA1C 5.7*   Fasting Lipid Panel:  Lab 01/24/11 0628  CHOL 265*  HDL 61  LDLCALC 171*  TRIG 163*  CHOLHDL 4.3  LDLDIRECT --  Coagulation:  Lab 01/23/11 1843  LABPROT 14.0  INR 1.06  Studies/Results: Dg Chest 2 View  01/23/2011  *RADIOLOGY REPORT*  Clinical Data: Fall.  Dementia.  CHEST - 2 VIEW  Comparison: 05/28/2008  Findings: Dual lead pacemaker remains in place.  The heart is that the upper limits normal in size.  Vascularity is normal.  Lungs are clear.  No effusions.  Ordinary degenerative changes effect the spine.  IMPRESSION: No active disease.  Dual lead pacemaker.  Original Report Authenticated By: Thomasenia Sales, M.D.  Ct Head Wo Contrast  01/23/2011  *RADIOLOGY REPORT*  Clinical Data: Loss of consciousness.  The fall, hit head.  CT HEAD WITHOUT CONTRAST  Technique:  Contiguous axial images were obtained from the base of the skull through the vertex without contrast.  Comparison: 08/03/2010  Findings: There is atrophy and chronic small vessel disease changes. No acute intracranial abnormality.  Specifically, no hemorrhage, hydrocephalus, mass lesion, acute infarction, or significant intracranial injury.  No acute calvarial abnormality. Rounded soft tissue in the floor the right maxillary sinus. Mastoids are clear.  IMPRESSION: No acute intracranial abnormality.  Atrophy, chronic microvascular disease.  Original Report Authenticated By: Cyndie Chime, M.D.   Medications: I have reviewed the patient's current medications. Scheduled Meds:   .  ALPRAZolam  0.5 mg Oral Once  . aspirin  325 mg Oral Daily  . donepezil  10 mg Oral QHS  . DULoxetine  30 mg Oral Q0700  . heparin  5,000 Units Subcutaneous Q8H  . levothyroxine  125 mcg Oral Custom   And  . levothyroxine  250 mcg Oral Custom  . metoprolol  50 mg Oral Daily  . potassium chloride SA  40 mEq Oral BID  . Vitamin D (Ergocalciferol)  50,000 Units Oral Q7 days  . DISCONTD: aspirin EC  81 mg Oral Daily  . DISCONTD: potassium chloride SA  20 mEq Oral BID   Continuous Infusions:   . DISCONTD: 0.9 % NaCl with KCl 20 mEq / L 20 mL/hr at 01/23/11 2251   PRN Meds:.acetaminophen, ALPRAZolam, nitroGLYCERIN, ondansetron (ZOFRAN) IV Assessment/Plan: 1. Syncope - doubtful seizure but EEG done this AM. If results are normal neurology will sign off with the recommendation to switch ASA to 325 mg for better cardiac prophylaxis. No AED therapy at this time, unless obvious abnormality with EEG. If EEG is abnormal we will make further recommendations otherwise Neurology will sign off.   2. Please call with questions.    Dementia  Pacemaker   LOS: 2 days   Genella Mech 01/25/2011, 10:25 AM  Addendum: EEG was normal. Please complete syncope work-up. Call with questions.  Carmell Austria

## 2011-01-26 ENCOUNTER — Inpatient Hospital Stay (HOSPITAL_COMMUNITY): Payer: Medicare Other

## 2011-01-26 DIAGNOSIS — R55 Syncope and collapse: Secondary | ICD-10-CM | POA: Diagnosis present

## 2011-01-26 MED ORDER — XENON XE 133 GAS
5.6000 | GAS_FOR_INHALATION | Freq: Once | RESPIRATORY_TRACT | Status: AC | PRN
  Administered 2011-01-26: 6 via RESPIRATORY_TRACT

## 2011-01-26 MED ORDER — LEVOTHYROXINE SODIUM 125 MCG PO TABS: 125.0000 ug | ORAL_TABLET | Freq: Every day | ORAL | Status: DC

## 2011-01-26 MED ORDER — LEVOTHYROXINE SODIUM 125 MCG PO TABS: 250.0000 ug | ORAL_TABLET | Freq: Every day | ORAL | Status: DC

## 2011-01-26 MED ORDER — TECHNETIUM TO 99M ALBUMIN AGGREGATED
5.1000 | Freq: Once | INTRAVENOUS | Status: AC | PRN
  Administered 2011-01-26: 5 via INTRAVENOUS

## 2011-01-26 NOTE — Progress Notes (Signed)
md paged to notify of results of 2d echo. Awaiting response.

## 2011-01-26 NOTE — Progress Notes (Signed)
Subjective:  Awake. No complaints, no SOB, no CP. No further syncope. No dizziness. Nurse states that she can be quite belligerent. Pleasant with me this am.  Yesterday Dr. Mayford Knife ordered D-Dimer. Was positive. Due to shellfish allergy ordered NUC VQ scan.   Objective:  Vital Signs in the last 24 hours: Temp:  [97.6 F (36.4 C)-98.5 F (36.9 C)] 97.9 F (36.6 C) (12/15 0500) Pulse Rate:  [61-71] 67  (12/15 0500) Resp:  [16-18] 16  (12/15 0500) BP: (114-152)/(74-97) 117/81 mmHg (12/15 0500) SpO2:  [93 %-95 %] 95 % (12/15 0500) Weight:  [77.4 kg (170 lb 10.2 oz)] 170 lb 10.2 oz (77.4 kg) (12/14 2013)  Intake/Output from previous day: 12/14 0701 - 12/15 0700 In: 840 [P.O.:840] Out: 3 [Urine:2; Stool:1]   Physical Exam: General: Well developed, well nourished, in no acute distress. Head:  Normocephalic and atraumatic. Lungs: Clear to auscultation and percussion. Heart: Normal S1 and S2.  1/6 S murmur RUSB, no rubs or gallops.  Pulses: Pulses normal. Extremities: No clubbing or cyanosis. No edema. Neurologic: Alert in NAD    Lab Results:  Basename 01/23/11 1843 01/23/11 1210  WBC 8.4 5.4  HGB 14.4 13.7  PLT 212 190    Basename 01/25/11 0615 01/24/11 0628  NA 140 141  K 4.2 3.2*  CL 106 103  CO2 26 27  GLUCOSE 88 94  BUN 22 24*  CREATININE 0.88 0.98    Basename 01/24/11 0051 01/23/11 1843  TROPONINI <0.30 <0.30   Hepatic Function Panel  Basename 01/23/11 1210  PROT 7.6  ALBUMIN 3.8  AST 20  ALT 12  ALKPHOS 53  BILITOT 0.4  BILIDIR --  IBILI --    Basename 01/24/11 0628  CHOL 265*   No results found for this basename: PROTIME in the last 72 hours  Imaging: CT head - No acute intracranial abnormality   EKG:  Tele - personally reviewed. A paced. No abnormalities.   Cardiac Studies:  Await echo ordered by Dr. Mayford Knife  Assessment/Plan:  Active Problems:  Dementia  Pacemaker  Syncope - may be orthostatic, ?if she took to many lasix per daughter.  Was in corner of room unable to speak at home. BP stable in hospital. Neuro work up done. EEG results normal. Doubt PE but Dr. Mayford Knife ordered a D-dimer and it was positive. Awaiting VQ scan. Shellfish allergy.  Await echo.   Pacer - stable interrogation in ED. High rate episodes per Dr. Eldridge Dace note. "There were some episodes of high ventricular rates. Some minor adjustments were made to the pacemaker. She has been known to have atrial fibrillation in the past but has not been on Coumadin due to her dementia and falls risk." No evidence of VT while here.   AFIB -  Now in NSR/ A paced.   Dementia - stable. Lives with daughter. PT requests full time assistance.     SKAINS, MARK 01/26/2011, 7:40 AM

## 2011-01-26 NOTE — Procedures (Signed)
EEG NUMBER:  [id number].  HISTORY:  This is an 77 years old woman with syncope.  MEDICATIONS:  Xanax.  CONDITION OF RECORDING:  This 16-lead EEG was performed with the patient in an awake state.  Background rhythm: background patterns in wakefulness were well-organized with a well-sustained posterior dominant rhythm of 10 to 10.5 Hz, symmetrical to reactive to eye opening and closing.  Abnormal potentials: no epileptiform activity or focal slowing was noted.  ACTIVATION PROCEDURES:  Hyperventilation was not performed.  Photic stimulation did not activate tracing.  EKG:  Single-channel EKG monitoring detected an irregular rhythm.  IMPRESSION:  This is a normal EEG.  A normal EEG does not rule out the clinical diagnosis of epilepsy. If clinically warranted, a repeat extended EEG or ambulatory requiring may be obtained for prolonged recording times and sleep capture, which may increase the diagnostic yield. Single channel EKG monitoring detected an irregular rhythm.  If clinically warranted, clinical correlation was suggested.          ______________________________ Carmell Austria, MD    [id number] D:  01/25/2011 13:30:53  T:  01/25/2011 20:06:35  Job #:  [id number]

## 2011-01-26 NOTE — Discharge Summary (Signed)
Patient ID: [name] MRN: [id number] DOB/AGE: 75-Oct-1928 75 y.o.  Admit date: 01/23/2011 Discharge date: 01/26/2011  Primary Discharge Diagnosis:  Syncope - unexplained cause, possibly orthostasis. Pacer check normal. EEG, head CT normal. ECHO with mild aortic stenosis (not hemodynamically significant, normal EF). D-dimer was positive and V/Q was interpreted as intermediate probability however the filling defect seen in left upper lung filed correlated with site of pacemaker - artifact. She is not short of breath.   Tele shows a pacing. No arrhythmia. She is ready to go. Feeling better. No complaints. Lives with daughter.   Secondary Discharge Diagnosis  Dementia -  Had sundowning during hospitalization.     Significant Diagnostic Studies: as above  Consults: Neuro - as above, negative.  Hospital Course: HPI from admit: 77 year old woman with mild dementia who has had a pacemaker placed in the past. She felt leg swelling yesterday. She took a Lasix and fell at she urinated quite a bit. This morning she woke up and went to the kitchen. She had no warning but suddenly passed out. Her daughter found her after she was down for about a minute. The patient states that she has been feeling a little weak for the past month but there've been no specific symptoms. The daughter reports that the patient was slurring her words initially. Her right hand was clenched fist as well. After a couple of minutes, she returned to normal. Due to this concern for stroke, they came to the emergency room.  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 She has done well since that time for the most part. Her pacemaker was interrogated when she came to the emergency room today. There were some episodes of high ventricular rates. Some minor adjustments were made to the pacemaker. She has been known to have atrial fibrillation in the past but has not been on Coumadin due to her  dementia and falls risk. She does not recall any palpitations prior to passing out. She did not feel warm. She did not feel like she had any warning. She felt like it was quite sudden. She has not had any chest pain or shortness of breath. She did have some mild leg swelling prompting Lasix usage yesterday.  Discharge Exam: Blood pressure 121/73, pulse 58, temperature 98.5 F (36.9 C), temperature source Oral, resp. rate 18, weight 77.4 kg (170 lb 10.2 oz), SpO2 95.00%.    GEN: pleasant alert in NAD CV: RRR, 2/6 SM LUNGS: CTA ABD: soft NT +BS EXT: no edema Neuro: non focal Labs:   Lab Results  Component Value Date   WBC 8.4 01/23/2011   HGB 14.4 01/23/2011   HCT 41.3 01/23/2011   MCV 92.8 01/23/2011   PLT 212 01/23/2011    Lab 01/25/11 0615 01/23/11 1210  NA 140 --  K 4.2 --  CL 106 --  CO2 26 --  BUN 22 --  CREATININE 0.88 --  CALCIUM 8.8 --  PROT -- 7.6  BILITOT -- 0.4  ALKPHOS -- 53  ALT -- 12  AST -- 20  GLUCOSE 88 --   Lab Results  Component Value Date   CKTOTAL 269* 01/24/2011   CKMB 4.9* 01/24/2011   TROPONINI <0.30 01/24/2011    Radiology: as above    FOLLOW UP PLANS AND APPOINTMENTS Discharge Orders    Future Orders Please Complete By Expires   Diet - low sodium heart healthy      Increase activity slowly  Current Discharge Medication List    CONTINUE these medications which have NOT CHANGED   Details  donepezil (ARICEPT) 10 MG tablet Take 10 mg by mouth at bedtime.      DULoxetine (CYMBALTA) 30 MG capsule Take 30 mg by mouth every morning.      furosemide (LASIX) 40 MG tablet Take 40 mg by mouth daily as needed. For fluid     levothyroxine (SYNTHROID, LEVOTHROID) 125 MCG tablet Take 125-250 mcg by mouth daily. Take 1 tablet by mouth Monday thru Friday. And take 2 tablets by mouth Saturday and sunday     metoprolol (TOPROL-XL) 50 MG 24 hr tablet Take 50 mg by mouth daily.      potassium chloride SA (K-DUR,KLOR-CON) 20 MEQ tablet Take  20 mEq by mouth 2 (two) times daily.      Vitamin D, Ergocalciferol, (DRISDOL) 50000 UNITS CAPS Take 50,000 Units by mouth every 7 (seven) days. monday       Follow-up Information    Make an appointment with Julian Hy. (7-14 days)    Contact information:   [address] [facility], [place]. [facility] [place] [address] [phone]          BRING ALL MEDICATIONS WITH YOU TO FOLLOW UP APPOINTMENTS  Time spent with patient to include physician time: 40 min, with med reconciliation, lab review, discussion with family and patient. SignedDonato Schultz 01/26/2011, 2:41 PM

## 2011-01-26 NOTE — Progress Notes (Signed)
  Echocardiogram 2D Echocardiogram has been performed.  Resa Rinks, Real Cons 01/26/2011, 9:29 AM

## 2011-01-26 NOTE — Progress Notes (Signed)
Discharge instructions reviewed with patient and her dtr, they verbalized understanding. Patient in no acute distress. Accompanied by private sitter via wheelchair downstairs.

## 2011-07-09 ENCOUNTER — Emergency Department (HOSPITAL_COMMUNITY): Payer: Medicare Other

## 2011-07-09 ENCOUNTER — Encounter (HOSPITAL_COMMUNITY): Payer: Self-pay | Admitting: *Deleted

## 2011-07-09 ENCOUNTER — Emergency Department (HOSPITAL_COMMUNITY)
Admission: EM | Admit: 2011-07-09 | Discharge: 2011-07-09 | Disposition: A | Discharge status: Home / Self Care | Payer: Medicare Other | Attending: Emergency Medicine | Admitting: Emergency Medicine

## 2011-07-09 DIAGNOSIS — W1809XA Striking against other object with subsequent fall, initial encounter: Secondary | ICD-10-CM | POA: Insufficient documentation

## 2011-07-09 DIAGNOSIS — Y92009 Unspecified place in unspecified non-institutional (private) residence as the place of occurrence of the external cause: Secondary | ICD-10-CM | POA: Insufficient documentation

## 2011-07-09 DIAGNOSIS — T148XXA Other injury of unspecified body region, initial encounter: Secondary | ICD-10-CM

## 2011-07-09 DIAGNOSIS — M25559 Pain in unspecified hip: Secondary | ICD-10-CM | POA: Insufficient documentation

## 2011-07-09 DIAGNOSIS — E039 Hypothyroidism, unspecified: Secondary | ICD-10-CM | POA: Insufficient documentation

## 2011-07-09 DIAGNOSIS — M25569 Pain in unspecified knee: Secondary | ICD-10-CM | POA: Insufficient documentation

## 2011-07-09 DIAGNOSIS — S301XXA Contusion of abdominal wall, initial encounter: Secondary | ICD-10-CM | POA: Insufficient documentation

## 2011-07-09 MED ORDER — TRAMADOL HCL 50 MG PO TABS: 50.0000 mg | ORAL_TABLET | Freq: Four times a day (QID) | ORAL | Status: AC | PRN

## 2011-07-09 NOTE — Discharge Instructions (Signed)

## 2011-07-09 NOTE — ED Provider Notes (Signed)
History     CSN: [id number]  Arrival date & time 07/09/11  1736   First MD Initiated Contact with Patient 07/09/11 2019      Chief Complaint  Patient presents with  . Hip Pain  . Knee Pain  . Fall    HPI Pt fell  A week ago.  She had fallen and hit her back against the dishwasher door.  She also fell and landed on her bottom.  Pt cannot exactly remember the details but family noticed the problem today so they brought her to the ED.   No vomiting , fevers or coughing.  Pt had not been complaining much until yesterday and today.  The pain increases with movement.  Pt states the pain is mostly in the hip area on the right side. The family also noted a bruise on her left flank area however that is not really giving her any trouble. She has been able to walk with her walker.  Past Medical History  Diagnosis Date  . Dementia     "very mild"  . Hypothyroidism   . Dysrhythmia     Past Surgical History  Procedure Date  . Pacemaker insertion   . Insert / replace / remove pacemaker   . Throidectomy     No family history on file.  History  Substance Use Topics  . Smoking status: Never Smoker   . Smokeless tobacco: Never Used  . Alcohol Use: No    OB History    Grav Para Term Preterm Abortions TAB SAB Ect Mult Living                  Review of Systems  All other systems reviewed and are negative.    Allergies  Shellfish allergy  Home Medications   Current Outpatient Rx  Name Route Sig Dispense Refill  . DONEPEZIL HCL 10 MG PO TABS Oral Take 10 mg by mouth at bedtime.      . FUROSEMIDE 40 MG PO TABS Oral Take 40 mg by mouth daily as needed. For fluid     . METOPROLOL SUCCINATE ER 50 MG PO TB24 Oral Take 50 mg by mouth daily.      Marland Kitchen POTASSIUM CHLORIDE CRYS ER 20 MEQ PO TBCR Oral Take 20 mEq by mouth 2 (two) times daily.      Marland Kitchen VITAMIN D (ERGOCALCIFEROL) 50000 UNITS PO CAPS Oral Take 50,000 Units by mouth every 7 (seven) days. monday      BP 121/71  Pulse 62   Temp(Src) 98.1 F (36.7 C) (Oral)  Resp 16  SpO2 97%  Physical Exam  Nursing note and vitals reviewed. Constitutional: She appears well-developed and well-nourished. No distress.  HENT:  Head: Normocephalic and atraumatic.  Right Ear: External ear normal.  Left Ear: External ear normal.  Eyes: Conjunctivae are normal. Right eye exhibits no discharge. Left eye exhibits no discharge. No scleral icterus.  Neck: Neck supple. No tracheal deviation present.  Cardiovascular: Normal rate, regular rhythm and intact distal pulses.   Pulmonary/Chest: Effort normal and breath sounds normal. No stridor. No respiratory distress. She has no wheezes. She has no rales.  Abdominal: Soft. Bowel sounds are normal. She exhibits no distension. There is no tenderness. There is no rebound and no guarding.  Musculoskeletal: She exhibits no edema and no tenderness.       Right hip: Normal. She exhibits normal range of motion, normal strength, no tenderness, no bony tenderness, no swelling, no crepitus and no deformity.  Right knee: She exhibits normal range of motion, no swelling, no effusion and no ecchymosis.       Bruising noted on left flank, no crepitus, no tenderness  Neurological: She is alert. She has normal strength. No sensory deficit. Cranial nerve deficit:  no gross defecits noted. She exhibits normal muscle tone. She displays no seizure activity. Coordination normal.  Skin: Skin is warm and dry. No rash noted.  Psychiatric: She has a normal mood and affect.    ED Course  Procedures (including critical care time)  Labs Reviewed - No data to display Dg Hip Complete Right  07/09/2011  *RADIOLOGY REPORT*  Clinical Data: 78 year old female status post fall with right hip pain, knee pain.  RIGHT HIP - COMPLETE 2+ VIEW  Comparison: None.  Findings: The femoral heads are normally located.  Advanced degenerative changes in the visible lower lumbar spine with bulky endplate osteophytosis.  Calcified  atherosclerosis and phleboliths. No definite acute fracture of the proximal right femur.  SI joints within normal limits.  No pelvic fracture identified.  IMPRESSION: No acute fracture or dislocation identified about the right hip and pelvis.  If there is suspicion of occult fracture or if the patient refuses to weight bear, MRI of the hip is the preferred modality for further evaluation.  Original Report Authenticated By: Harley Hallmark, M.D.   Dg Knee Complete 4 Views Right  07/09/2011  *RADIOLOGY REPORT*  Clinical Data: 78 year old female status post fall with pain.  RIGHT KNEE - COMPLETE 4+ VIEW  Comparison: None.  Findings: Chondrocalcinosis.  Possible trace joint effusion. Patella intact.  Degenerative spurring.  No acute fracture or dislocation identified.  IMPRESSION: No acute fracture or dislocation identified about the right knee. Possible small joint effusion. Chondrocalcinosis which can be seen in the setting of calcium pyrophosphate deposition disease.  Original Report Authenticated By: Harley Hallmark, M.D.     1. Contusion       MDM  The patient is having pain in her hip and right knee however she has been able to ambulate and x-rays are without evidence of acute fracture. I did discuss the possibility of an occult fracture with the patient and her daughter however at this time i have a low suspicion for that. patient states the pain has been getting better and she has been able to ambulate.  Discussed rib films but she is not having any pain there at this time.        Celene Kras, MD 07/09/11 2049

## 2011-07-09 NOTE — ED Notes (Signed)
Pt reports falling a few days ago, thinks she slipped on kitchen floor. C/o R hip and knee pain, difficult to ambulate on. Denies LOC, hitting head.

## 2011-09-03 ENCOUNTER — Ambulatory Visit: Payer: Self-pay | Admitting: Emergency Medicine

## 2011-09-10 ENCOUNTER — Ambulatory Visit (INDEPENDENT_AMBULATORY_CARE_PROVIDER_SITE_OTHER): Payer: Medicare Other | Admitting: Emergency Medicine

## 2011-09-10 VITALS — BP 136/92 | HR 78 | Temp 97.1°F | Resp 16 | Ht 63.0 in | Wt 164.0 lb

## 2011-09-10 DIAGNOSIS — F0390 Unspecified dementia without behavioral disturbance: Secondary | ICD-10-CM

## 2011-09-10 DIAGNOSIS — E559 Vitamin D deficiency, unspecified: Secondary | ICD-10-CM

## 2011-09-10 DIAGNOSIS — E079 Disorder of thyroid, unspecified: Secondary | ICD-10-CM

## 2011-09-10 DIAGNOSIS — I10 Essential (primary) hypertension: Secondary | ICD-10-CM

## 2011-09-10 DIAGNOSIS — E782 Mixed hyperlipidemia: Secondary | ICD-10-CM

## 2011-09-10 MED ORDER — LEVOTHYROXINE SODIUM 137 MCG PO TABS: ORAL_TABLET | ORAL | Status: DC

## 2011-09-10 MED ORDER — FUROSEMIDE 40 MG PO TABS: 40.0000 mg | ORAL_TABLET | Freq: Every day | ORAL | Status: DC | PRN

## 2011-09-10 MED ORDER — ROSUVASTATIN CALCIUM 10 MG PO TABS: 10.0000 mg | ORAL_TABLET | Freq: Every day | ORAL | Status: DC

## 2011-09-10 MED ORDER — POTASSIUM CHLORIDE CRYS ER 20 MEQ PO TBCR: 20.0000 meq | EXTENDED_RELEASE_TABLET | Freq: Two times a day (BID) | ORAL | Status: DC

## 2011-09-10 MED ORDER — METOPROLOL SUCCINATE ER 50 MG PO TB24: 50.0000 mg | ORAL_TABLET | Freq: Every day | ORAL | Status: DC

## 2011-09-10 MED ORDER — VITAMIN D (ERGOCALCIFEROL) 1.25 MG (50000 UNIT) PO CAPS: 50000.0000 [IU] | ORAL_CAPSULE | ORAL | Status: DC

## 2011-09-10 MED ORDER — DONEPEZIL HCL 10 MG PO TABS: 10.0000 mg | ORAL_TABLET | Freq: Every day | ORAL | Status: DC

## 2011-09-10 NOTE — Progress Notes (Signed)
  Subjective:    Patient ID: [name], female    DOB: [birth year], 78 y.o.   MRN: [id number]  HPI patient in followup multiple medical problems. She has dementia secondary to Alzheimer's disease. She has heart disease with a pacemaker in. She has microvascular disease. These issues been living situation. Her daughter is taken off work so she can help care for her at home. She has an unstable gait and walks with the assistance of a walker she has not fallen recently    Review of Systems     Objective:   Physical Exam she is alert and cooperative but has very minimal short-term memory. Her chest was clear cardiac exam unremarkable extremities without edema. Her neurological exam is nonfocal        Assessment & Plan:  Patient stable on current medication regimen. I repeated her blood pressure was 120/82. Daughter was requesting additional Alzheimer and strokes but I was hesitant to put her on Namenda because I feel that it will have minimal benefit. Issue right now would be to try and get a better living situation .

## 2011-09-11 LAB — CBC WITH DIFFERENTIAL/PLATELET
Basophils Absolute: 0 10*3/uL (ref 0.0–0.1)
Basophils Relative: 0 % (ref 0–1)
Eosinophils Absolute: 0.1 10*3/uL (ref 0.0–0.7)
Eosinophils Relative: 2 % (ref 0–5)
HCT: 38 % (ref 36.0–46.0)
Lymphocytes Relative: 28 % (ref 12–46)
MCH: 31 pg (ref 26.0–34.0)
MCHC: 34.2 g/dL (ref 30.0–36.0)
MCV: 90.7 fL (ref 78.0–100.0)
Monocytes Absolute: 0.6 10*3/uL (ref 0.1–1.0)
Platelets: 237 10*3/uL (ref 150–400)
RDW: 14.6 % (ref 11.5–15.5)

## 2011-09-11 LAB — COMPREHENSIVE METABOLIC PANEL
AST: 22 U/L (ref 0–37)
BUN: 20 mg/dL (ref 6–23)
Calcium: 9.2 mg/dL (ref 8.4–10.5)
Chloride: 104 mEq/L (ref 96–112)
Creat: 0.82 mg/dL (ref 0.50–1.10)
Total Bilirubin: 0.6 mg/dL (ref 0.3–1.2)

## 2011-09-11 LAB — LIPID PANEL
Cholesterol: 256 mg/dL — ABNORMAL HIGH (ref 0–200)
HDL: 63 mg/dL (ref >39)
Triglycerides: 105 mg/dL (ref <150)
VLDL: 21 mg/dL (ref 0–40)

## 2012-01-21 ENCOUNTER — Ambulatory Visit: Payer: Medicare Other | Admitting: Emergency Medicine

## 2012-04-05 ENCOUNTER — Emergency Department (HOSPITAL_COMMUNITY): Payer: Medicare Other

## 2012-04-05 ENCOUNTER — Emergency Department (HOSPITAL_COMMUNITY)
Admission: EM | Admit: 2012-04-05 | Discharge: 2012-04-06 | Disposition: A | Discharge status: Home / Self Care | Payer: Medicare Other | Attending: Emergency Medicine | Admitting: Emergency Medicine

## 2012-04-05 ENCOUNTER — Encounter (HOSPITAL_COMMUNITY): Payer: Self-pay | Admitting: Radiology

## 2012-04-05 DIAGNOSIS — E039 Hypothyroidism, unspecified: Secondary | ICD-10-CM | POA: Insufficient documentation

## 2012-04-05 DIAGNOSIS — R031 Nonspecific low blood-pressure reading: Secondary | ICD-10-CM | POA: Insufficient documentation

## 2012-04-05 DIAGNOSIS — Z8679 Personal history of other diseases of the circulatory system: Secondary | ICD-10-CM | POA: Insufficient documentation

## 2012-04-05 DIAGNOSIS — Z79899 Other long term (current) drug therapy: Secondary | ICD-10-CM | POA: Insufficient documentation

## 2012-04-05 DIAGNOSIS — Z950 Presence of cardiac pacemaker: Secondary | ICD-10-CM | POA: Insufficient documentation

## 2012-04-05 DIAGNOSIS — R55 Syncope and collapse: Secondary | ICD-10-CM | POA: Insufficient documentation

## 2012-04-05 DIAGNOSIS — F0390 Unspecified dementia without behavioral disturbance: Secondary | ICD-10-CM | POA: Insufficient documentation

## 2012-04-05 LAB — PRO B NATRIURETIC PEPTIDE: Pro B Natriuretic peptide (BNP): 1057 pg/mL — ABNORMAL HIGH (ref 0–450)

## 2012-04-05 LAB — POCT I-STAT TROPONIN I: Troponin i, poc: 0.02 ng/mL (ref 0.00–0.08)

## 2012-04-05 LAB — POCT I-STAT, CHEM 8
BUN: 21 mg/dL (ref 6–23)
Calcium, Ion: 1.12 mmol/L — ABNORMAL LOW (ref 1.13–1.30)
Chloride: 108 mEq/L (ref 96–112)

## 2012-04-05 LAB — CBC WITH DIFFERENTIAL/PLATELET
Basophils Absolute: 0 K/uL (ref 0.0–0.1)
Basophils Relative: 0 % (ref 0–1)
Eosinophils Absolute: 0.1 K/uL (ref 0.0–0.7)
Eosinophils Relative: 1 % (ref 0–5)
HCT: 36.6 % (ref 36.0–46.0)
Hemoglobin: 12.3 g/dL (ref 12.0–15.0)
Lymphocytes Relative: 13 % (ref 12–46)
Lymphs Abs: 1.1 K/uL (ref 0.7–4.0)
MCH: 31 pg (ref 26.0–34.0)
MCHC: 33.6 g/dL (ref 30.0–36.0)
MCV: 92.2 fL (ref 78.0–100.0)
Monocytes Absolute: 0.8 K/uL (ref 0.1–1.0)
Monocytes Relative: 10 % (ref 3–12)
Neutro Abs: 6.3 K/uL (ref 1.7–7.7)
Neutrophils Relative %: 77 % (ref 43–77)
Platelets: 168 K/uL (ref 150–400)
RBC: 3.97 MIL/uL (ref 3.87–5.11)
RDW: 13.8 % (ref 11.5–15.5)
WBC: 8.2 K/uL (ref 4.0–10.5)

## 2012-04-05 LAB — COMPREHENSIVE METABOLIC PANEL WITH GFR
ALT: 13 U/L (ref 0–35)
AST: 22 U/L (ref 0–37)
Albumin: 3.2 g/dL — ABNORMAL LOW (ref 3.5–5.2)
Alkaline Phosphatase: 55 U/L (ref 39–117)
BUN: 20 mg/dL (ref 6–23)
CO2: 27 meq/L (ref 19–32)
Calcium: 8.7 mg/dL (ref 8.4–10.5)
Chloride: 104 meq/L (ref 96–112)
Creatinine, Ser: 0.75 mg/dL (ref 0.50–1.10)
GFR calc Af Amer: 88 mL/min — ABNORMAL LOW
GFR calc non Af Amer: 76 mL/min — ABNORMAL LOW
Glucose, Bld: 111 mg/dL — ABNORMAL HIGH (ref 70–99)
Potassium: 3.6 meq/L (ref 3.5–5.1)
Sodium: 140 meq/L (ref 135–145)
Total Bilirubin: 0.6 mg/dL (ref 0.3–1.2)
Total Protein: 6.8 g/dL (ref 6.0–8.3)

## 2012-04-05 LAB — D-DIMER, QUANTITATIVE: D-Dimer, Quant: 2.53 ug/mL-FEU — ABNORMAL HIGH (ref 0.00–0.48)

## 2012-04-05 MED ORDER — IOHEXOL 350 MG/ML SOLN: 100.0000 mL | Freq: Once | INTRAVENOUS | Status: AC | PRN

## 2012-04-05 MED ORDER — SODIUM CHLORIDE 0.9 % IV SOLN: INTRAVENOUS | Status: DC

## 2012-04-05 NOTE — ED Notes (Signed)
Per EMS, pt's daughter states that pt was standing at kitchen sink and began shaking.  Pt's daughter thought pt was going to pass out.  EMS called.  Upon EMS arrival, pt's BP found to be 80/50.  Pt was then assisted to standing position and BP was 70/50.  20G to R wrist started and fluids started.  BP increased to 127/51.  Pt's daughter reports pt has hx of dementia.  Pt has pacemaker.  Pt c/o increased weakness.  EMS reports strong smell of urine on pt.

## 2012-04-05 NOTE — ED Provider Notes (Signed)
History     CSN: [id number]  Arrival date & time 04/05/12  3086   First MD Initiated Contact with Patient 04/05/12 1856      Chief Complaint  Patient presents with  . Weakness    (Consider location/radiation/quality/duration/timing/severity/associated sxs/prior treatment) HPI Comments: Pt's daughter says that this week pt had her days and nights mixed up.  She has seemed really weak.  She was in kitchen and her arms started shaking.  EMS was called and her blood pressure was low at 70/50.  In May 2013 her potassium was low.  Her TSH has been low and she has seen Dr. Adrian Prince, endocrinologist, about this.  Pt has significant dementia and is unable to give history or relate review of systems.  Patient is a 79 y.o. female presenting with weakness. The history is provided by the patient and a relative. No language interpreter was used.  Weakness This is a new problem. Episode onset: 2-3 days. The problem occurs constantly. The problem has been gradually worsening. Pertinent negatives include no chest pain, no abdominal pain, no headaches and no shortness of breath. Nothing aggravates the symptoms. Nothing relieves the symptoms. She has tried nothing (To ED via EMS. ) for the symptoms.    Past Medical History  Diagnosis Date  . Dementia     "very mild"  . Hypothyroidism   . Dysrhythmia     Past Surgical History  Procedure Laterality Date  . Pacemaker insertion    . Insert / replace / remove pacemaker    . Throidectomy      No family history on file.  History  Substance Use Topics  . Smoking status: Never Smoker   . Smokeless tobacco: Never Used  . Alcohol Use: No    OB History   Grav Para Term Preterm Abortions TAB SAB Ect Mult Living                  Review of Systems  Unable to perform ROS: Dementia  Respiratory: Negative for shortness of breath.   Cardiovascular: Negative for chest pain.  Gastrointestinal: Negative for abdominal pain.  Neurological:  Positive for weakness. Negative for headaches.    Allergies  Shellfish allergy  Home Medications   Current Outpatient Rx  Name  Route  Sig  Dispense  Refill  . donepezil (ARICEPT) 10 MG tablet   Oral   Take 1 tablet (10 mg total) by mouth at bedtime.   30 tablet   11   . furosemide (LASIX) 40 MG tablet   Oral   Take 40 mg by mouth daily as needed. For fluid retention         . levothyroxine (SYNTHROID, LEVOTHROID) 137 MCG tablet   Oral   Take 137 mcg by mouth daily. Take one tablet daily         . potassium chloride SA (K-DUR,KLOR-CON) 20 MEQ tablet   Oral   Take 20 mEq by mouth daily as needed. Takes when taking Lasix         . rosuvastatin (CRESTOR) 10 MG tablet   Oral   Take 1 tablet (10 mg total) by mouth daily.   30 tablet   11   . Vitamin D, Ergocalciferol, (DRISDOL) 50000 UNITS CAPS   Oral   Take 50,000 Units by mouth every 7 (seven) days. monday           BP 143/71  Pulse 80  Temp(Src) 97.7 F (36.5 C) (Oral)  Resp 16  SpO2 95%  Physical Exam  Nursing note and vitals reviewed. Constitutional:  Pleasantly demented elderly woman, no distress at rest.  BP 139/68.  HENT:  Head: Normocephalic and atraumatic.  Right Ear: External ear normal.  Left Ear: External ear normal.  Mouth/Throat: Oropharynx is clear and moist.  Eyes: Conjunctivae and EOM are normal. Pupils are equal, round, and reactive to light.  Neck: Normal range of motion. Neck supple.  Cardiovascular: Normal rate and regular rhythm.   Has systolic ejection murmur over the left sternal border.  Pulmonary/Chest: Effort normal and breath sounds normal.  Abdominal: Soft. Bowel sounds are normal.  Musculoskeletal: Normal range of motion. She exhibits no edema and no tenderness.  Neurological: She is alert.  No sensory or motor deficit.  Skin: Skin is warm and dry.  Psychiatric: She has a normal mood and affect. Her behavior is normal.    ED Course  Procedures (including critical  care time)  Pt was seen and had physical examination.  Lab workup was ordered.    8:50 PM  Date: 04/05/2012  Rate: 72  Rhythm: normal sinus rhythm  QRS Axis: left  Intervals: PR prolonged and QT prolonged  QRS: Left ventricular hypertrophy  ST/T Wave abnormalities: normal  Conduction Disutrbances:right bundle branch block and left anterior fascicular block  Narrative Interpretation: Abnormal EKG  Old EKG Reviewed: changes noted--Pacer spikes seen on tracing of 01/23/2011 no longer present.  Results for orders placed during the hospital encounter of 04/05/12  CBC WITH DIFFERENTIAL      Result Value Range   WBC 8.2  4.0 - 10.5 K/uL   RBC 3.97  3.87 - 5.11 MIL/uL   Hemoglobin 12.3  12.0 - 15.0 g/dL   HCT 16.1  09.6 - 04.5 %   MCV 92.2  78.0 - 100.0 fL   MCH 31.0  26.0 - 34.0 pg   MCHC 33.6  30.0 - 36.0 g/dL   RDW 40.9  81.1 - 91.4 %   Platelets 168  150 - 400 K/uL   Neutrophils Relative 77  43 - 77 %   Neutro Abs 6.3  1.7 - 7.7 K/uL   Lymphocytes Relative 13  12 - 46 %   Lymphs Abs 1.1  0.7 - 4.0 K/uL   Monocytes Relative 10  3 - 12 %   Monocytes Absolute 0.8  0.1 - 1.0 K/uL   Eosinophils Relative 1  0 - 5 %   Eosinophils Absolute 0.1  0.0 - 0.7 K/uL   Basophils Relative 0  0 - 1 %   Basophils Absolute 0.0  0.0 - 0.1 K/uL  COMPREHENSIVE METABOLIC PANEL      Result Value Range   Sodium 140  135 - 145 mEq/L   Potassium 3.6  3.5 - 5.1 mEq/L   Chloride 104  96 - 112 mEq/L   CO2 27  19 - 32 mEq/L   Glucose, Bld 111 (*) 70 - 99 mg/dL   BUN 20  6 - 23 mg/dL   Creatinine, Ser 7.82  0.50 - 1.10 mg/dL   Calcium 8.7  8.4 - 95.6 mg/dL   Total Protein 6.8  6.0 - 8.3 g/dL   Albumin 3.2 (*) 3.5 - 5.2 g/dL   AST 22  0 - 37 U/L   ALT 13  0 - 35 U/L   Alkaline Phosphatase 55  39 - 117 U/L   Total Bilirubin 0.6  0.3 - 1.2 mg/dL   GFR calc non Af Amer 76 (*) >90  mL/min   GFR calc Af Amer 88 (*) >90 mL/min  PRO B NATRIURETIC PEPTIDE      Result Value Range   Pro B Natriuretic  peptide (BNP) 1057.0 (*) 0 - 450 pg/mL  D-DIMER, QUANTITATIVE      Result Value Range   D-Dimer, Quant 2.53 (*) 0.00 - 0.48 ug/mL-FEU  POCT I-STAT, CHEM 8      Result Value Range   Sodium 144  135 - 145 mEq/L   Potassium 3.8  3.5 - 5.1 mEq/L   Chloride 108  96 - 112 mEq/L   BUN 21  6 - 23 mg/dL   Creatinine, Ser 1.61  0.50 - 1.10 mg/dL   Glucose, Bld 096 (*) 70 - 99 mg/dL   Calcium, Ion 0.45 (*) 1.13 - 1.30 mmol/L   TCO2 28  0 - 100 mmol/L   Hemoglobin 12.6  12.0 - 15.0 g/dL   HCT 40.9  81.1 - 91.4 %  POCT I-STAT TROPONIN I      Result Value Range   Troponin i, poc 0.02  0.00 - 0.08 ng/mL   Comment 3            Dg Chest 2 View  04/05/2012  *RADIOLOGY REPORT*  Clinical Data: Weakness, hypotension, almost passed out.  CHEST - 2 VIEW  Comparison: 01/26/2011  Findings: Stable appearance of cardiac pacemaker.  Mild cardiac enlargement with normal pulmonary vascularity.  No focal consolidation or airspace disease in the lungs.  No blunting of costophrenic angles.  No pneumothorax.  Degenerative changes in the thoracic spine.  Calcified and tortuous aorta.  Surgical clips in the base of the neck.  No significant change since previous study.  IMPRESSION: Mild cardiac enlargement.  No evidence of active pulmonary disease.   Original Report Authenticated By: Burman Nieves, M.D.    Ct Head Wo Contrast  04/05/2012  *RADIOLOGY REPORT*  Clinical Data: 79 year old female with weakness.  CT HEAD WITHOUT CONTRAST  Technique:  Contiguous axial images were obtained from the base of the skull through the vertex without contrast.  Comparison: 01/23/2011 CT  Findings: Atrophy and chronic small vessel white matter ischemic changes again noted.  No acute intracranial abnormalities are identified, including mass lesion or mass effect, hydrocephalus, extra-axial fluid collection, midline shift, hemorrhage, or acute infarction.  The visualized bony calvarium is unremarkable.  IMPRESSION: No evidence of acute  intracranial abnormality.  Atrophy and chronic small vessel white matter ischemic changes.   Original Report Authenticated By: Harmon Pier, M.D.     10:21 PM Review of lab results show an elevated D-dimer of 2.53.  BNP is elevated at 1057.  Will order CT angio of chest to check for PE.    1:26 AM CT angio of chest showed no PE.  Orthostatic vital signs were negative for orthostatic change.  Pt is ready to go home, and her daughter feels she has returned to her baseline.  Released home.  1. Near syncope            Carleene Cooper III, MD 04/06/12 [phone]

## 2012-04-06 ENCOUNTER — Encounter (HOSPITAL_COMMUNITY): Payer: Self-pay | Admitting: Radiology

## 2012-04-06 MED ORDER — IOHEXOL 350 MG/ML SOLN
80.0000 mL | Freq: Once | INTRAVENOUS | Status: AC | PRN
  Administered 2012-04-06: 80 mL via INTRAVENOUS

## 2012-04-20 ENCOUNTER — Encounter: Payer: Self-pay | Admitting: Emergency Medicine

## 2012-04-20 ENCOUNTER — Ambulatory Visit (INDEPENDENT_AMBULATORY_CARE_PROVIDER_SITE_OTHER): Payer: Medicare Other | Admitting: Emergency Medicine

## 2012-04-20 VITALS — BP 110/64 | HR 73 | Temp 97.7°F | Resp 20 | Ht 63.0 in | Wt 156.0 lb

## 2012-04-20 DIAGNOSIS — R011 Cardiac murmur, unspecified: Secondary | ICD-10-CM | POA: Insufficient documentation

## 2012-04-20 DIAGNOSIS — I509 Heart failure, unspecified: Secondary | ICD-10-CM

## 2012-04-20 MED ORDER — DONEPEZIL HCL 10 MG PO TABS: 10.0000 mg | ORAL_TABLET | Freq: Every day | ORAL | Status: DC

## 2012-04-20 NOTE — Progress Notes (Signed)
  Subjective:    Patient ID: [name], female    DOB: [birth year], 79 y.o.   MRN: [id number]  HPI patient was seen at the hospital 2 weeks ago because of neck assertive syncope. Evaluation at that time revealed no evidence of an acute myocardial infarct or pulmonary embolus. Patient does have a pacemaker. She is a history of dementia and this has been worsening recently. Of note she did have a CT angiogram which showed some fluid accumulation in the lungs consistent with congestive heart failure. She also had a BNP done which was significantly elevated. She is currently on Lasix 40 a day and Crestor. She also takes potassium supplementation. She has not seen her cardiologist recently.    Review of Systems     Objective:   Physical Exam physical exam reveals alert cooperative patient who is in no distress. Her neck is supple. Chest clear cardiac exam reveals a 2/6 systolic murmur at the left sternal border and base of the heart .        Assessment & Plan:  I have made an appointment to see [name] her regular cardiologist because of her abnormal CT angiogram of the chest and abnormal BNP. I do not change of medications at the present time. I did give the daughter the number for [facility] hands to see if they can help the daughter at home .

## 2012-07-21 ENCOUNTER — Ambulatory Visit (INDEPENDENT_AMBULATORY_CARE_PROVIDER_SITE_OTHER): Payer: Medicare Other | Admitting: Emergency Medicine

## 2012-07-21 ENCOUNTER — Encounter: Payer: Self-pay | Admitting: Emergency Medicine

## 2012-07-21 VITALS — BP 120/70 | HR 90 | Temp 98.0°F | Resp 16 | Ht 64.0 in | Wt 147.2 lb

## 2012-07-21 DIAGNOSIS — R112 Nausea with vomiting, unspecified: Secondary | ICD-10-CM

## 2012-07-21 DIAGNOSIS — I509 Heart failure, unspecified: Secondary | ICD-10-CM

## 2012-07-21 DIAGNOSIS — F0390 Unspecified dementia without behavioral disturbance: Secondary | ICD-10-CM

## 2012-07-21 LAB — POCT CBC
Granulocyte percent: 63.5 %G (ref 37–80)
MID (cbc): 0.5 (ref 0–0.9)
MPV: 8 fL (ref 0–99.8)
POC Granulocyte: 5 (ref 2–6.9)
POC MID %: 5.7 %M (ref 0–12)
Platelet Count, POC: 225 10*3/uL (ref 142–424)
RBC: 4.36 M/uL (ref 4.04–5.48)

## 2012-07-21 NOTE — Progress Notes (Signed)
  Subjective:    Patient ID: [name], female    DOB: March 19, 1926, 79 y.o.   MRN: [id number]  HPI patient here with her daughter. She was referred back to Dr. Mayford Knife but that appointment could not be made and she was seen by Dr. Rennis Golden but not evaluated and was referred back to Dr. Mayford Knife. This had to do with the billing problem and the daughter was unaware of this and she is going to contact Dr. Norris Cross office so this can be resolved and she can be seen back there again where she is followed for her pacemaker. She was seen with a syncopal episode in the emergency room found to have evidence of congestive failure but has not had evaluation of this since that episode. Memory continues to be a problem. The patient is very resistant to assisted living or a care facility. She had episodes of vomiting on Sunday and some on Monday but these have resolved now. She a good supper last night and a good breakfast this morning. She has not had any fever or complained of any abdominal pain    Review of Systems     Objective:   Physical Exam patient is alert and cooperative she is not in any distress. Her neck is supple. Her chest is clear to auscultation and percussion today. Cardiac exam reveals a regular rate without murmur. Abdomen is soft liver spleen are not enlarged there are no areas of tenderness to  Results for orders placed in visit on 07/21/12  POCT CBC      Result Value Range   WBC 7.9  4.6 - 10.2 K/uL   Lymph, poc 2.4  0.6 - 3.4   POC LYMPH PERCENT 30.8  10 - 50 %L   MID (cbc) 0.5  0 - 0.9   POC MID % 5.7  0 - 12 %M   POC Granulocyte 5.0  2 - 6.9   Granulocyte percent 63.5  37 - 80 %G   RBC 4.36  4.04 - 5.48 M/uL   Hemoglobin 13.2  12.2 - 16.2 g/dL   HCT, POC 78.2  95.6 - 47.9 %   MCV 100.0 (*) 80 - 97 fL   MCH, POC 30.3  27 - 31.2 pg   MCHC 30.3 (*) 31.8 - 35.4 g/dL   RDW, POC 21.3     Platelet Count, POC 225  142 - 424 K/uL   MPV 8.0  0 - 99.8 fL        Assessment & Plan:     I am not sure the cause of her vomiting. She states she feels fine now. Her wbc count is normal and exam is unremarkable.Referral made to Dr. Mayford Knife.

## 2012-07-22 LAB — COMPREHENSIVE METABOLIC PANEL
ALT: 23 U/L (ref 0–35)
AST: 36 U/L (ref 0–37)
Albumin: 3.8 g/dL (ref 3.5–5.2)
Alkaline Phosphatase: 46 U/L (ref 39–117)
Glucose, Bld: 63 mg/dL — ABNORMAL LOW (ref 70–99)
Potassium: 4 mEq/L (ref 3.5–5.3)
Sodium: 140 mEq/L (ref 135–145)
Total Protein: 7.1 g/dL (ref 6.0–8.3)

## 2012-07-22 LAB — LIPASE: Lipase: 58 U/L (ref 0–75)

## 2012-07-31 ENCOUNTER — Telehealth: Payer: Self-pay

## 2012-09-21 ENCOUNTER — Telehealth: Payer: Self-pay

## 2012-09-21 NOTE — Telephone Encounter (Signed)
DR DAUB  PATIENT DAUGHTER IS CALLING TO TALK TO YOU ABOUT HER MOM  [phone]

## 2012-09-22 MED ORDER — LORAZEPAM 0.5 MG PO TABS: 0.5000 mg | ORAL_TABLET | Freq: Two times a day (BID) | ORAL | Status: DC | PRN

## 2012-09-22 NOTE — Telephone Encounter (Signed)
Thanks. Will advise Daughter  to try one at bedtime. Can try during the day for agitation also. Left message for her daughter to call me back.

## 2012-09-22 NOTE — Telephone Encounter (Signed)
We can try Ativan 0.5 one half to one tablet every 8-12 hours as needed for agitation. She can have #60 tablets with one refill .

## 2012-09-22 NOTE — Telephone Encounter (Signed)
Called daughter, patient is ranting/raving for hours on end. Wants to know if she can give her something for the anxiety. Mother is upset/ confused. They are moving things now. Mother also not sleeping at night either.

## 2012-09-23 ENCOUNTER — Telehealth: Payer: Self-pay

## 2012-09-23 NOTE — Telephone Encounter (Signed)
Left message again for daughter to call back.

## 2012-09-23 NOTE — Telephone Encounter (Signed)
Yes, called to advise

## 2012-09-23 NOTE — Telephone Encounter (Signed)
Pts daughter is calling back to speak to Amy, she has been trying to get in touch with her to see if Amy had found if Dr. Cleta Alberts was going to be able to allow her mother to have another prescription called in since they are not able to get an apt with Dr Cleta Alberts. Call back number for Patsy is [phone]

## 2012-09-24 NOTE — Telephone Encounter (Signed)
Daughter was advised.

## 2012-10-14 ENCOUNTER — Other Ambulatory Visit: Payer: Self-pay | Admitting: Emergency Medicine

## 2012-10-27 ENCOUNTER — Ambulatory Visit (INDEPENDENT_AMBULATORY_CARE_PROVIDER_SITE_OTHER): Payer: Medicare Other | Admitting: Emergency Medicine

## 2012-10-27 ENCOUNTER — Encounter: Payer: Self-pay | Admitting: Emergency Medicine

## 2012-10-27 VITALS — BP 132/62 | HR 93 | Temp 98.3°F | Resp 16 | Ht 64.0 in | Wt 130.0 lb

## 2012-10-27 DIAGNOSIS — F05 Delirium due to known physiological condition: Secondary | ICD-10-CM

## 2012-10-27 DIAGNOSIS — F0280 Dementia in other diseases classified elsewhere without behavioral disturbance: Secondary | ICD-10-CM

## 2012-10-27 DIAGNOSIS — R634 Abnormal weight loss: Secondary | ICD-10-CM

## 2012-10-27 DIAGNOSIS — E039 Hypothyroidism, unspecified: Secondary | ICD-10-CM

## 2012-10-27 LAB — CBC
Hemoglobin: 12.7 g/dL (ref 12.0–15.0)
RBC: 4.07 MIL/uL (ref 3.87–5.11)

## 2012-10-27 LAB — COMPREHENSIVE METABOLIC PANEL
ALT: 14 U/L (ref 0–35)
Albumin: 3.8 g/dL (ref 3.5–5.2)
Alkaline Phosphatase: 46 U/L (ref 39–117)
CO2: 29 mEq/L (ref 19–32)
Glucose, Bld: 77 mg/dL (ref 70–99)
Potassium: 3.9 mEq/L (ref 3.5–5.3)
Sodium: 142 mEq/L (ref 135–145)
Total Protein: 6.6 g/dL (ref 6.0–8.3)

## 2012-10-27 NOTE — Progress Notes (Signed)
  Subjective:    Patient ID: [name], female    DOB: 04-26-1926, 79 y.o.   MRN: [id number]  HPI patient here for followup. She has a history of congestive heart failure with a pacemaker in. Apparently she has been refusing to eat over the last few months and has had a significant weight loss. She denies anything catching in her throat states her bowels been working normally. She is at home alone during the day while her daughter works the   Review of Systems     Objective:   Physical Exam patient is alert and cooperative here but does asked the same question over 2 minutes after she asked if previously. Her chest was clear her heart was regular rate without murmurs abdomen soft nontender without masses extremities edema        Assessment & Plan:  I am concerned that her weight loss may be due to being at home alone during the day and not having food or thinking of eating. They're going to start her on protein supplements twice a day I will recheck her in 6 weeks to see what her weight is doing is a documented weight loss of 17 pounds since June

## 2012-10-28 LAB — SEDIMENTATION RATE: Sed Rate: 9 mm/hr (ref 0–22)

## 2012-10-28 LAB — TSH: TSH: 0.024 u[IU]/mL — ABNORMAL LOW (ref 0.350–4.500)

## 2012-11-04 ENCOUNTER — Telehealth: Payer: Self-pay

## 2012-11-04 MED ORDER — LEVOTHYROXINE SODIUM 75 MCG PO TABS: 75.0000 ug | ORAL_TABLET | Freq: Every day | ORAL | Status: DC

## 2012-11-04 NOTE — Telephone Encounter (Signed)
Spoke with Dr. Cleta Alberts (see labs). He said to just discontinue the synthroid 137 and to send the new rx into pharm. Rx sent

## 2012-11-08 ENCOUNTER — Encounter: Payer: Self-pay | Admitting: *Deleted

## 2012-11-08 ENCOUNTER — Encounter: Payer: Self-pay | Admitting: Cardiology

## 2012-11-08 DIAGNOSIS — I4510 Unspecified right bundle-branch block: Secondary | ICD-10-CM | POA: Insufficient documentation

## 2012-11-08 DIAGNOSIS — F419 Anxiety disorder, unspecified: Secondary | ICD-10-CM | POA: Insufficient documentation

## 2012-11-08 DIAGNOSIS — E785 Hyperlipidemia, unspecified: Secondary | ICD-10-CM | POA: Insufficient documentation

## 2012-11-08 DIAGNOSIS — I10 Essential (primary) hypertension: Secondary | ICD-10-CM | POA: Insufficient documentation

## 2012-11-08 DIAGNOSIS — E049 Nontoxic goiter, unspecified: Secondary | ICD-10-CM | POA: Insufficient documentation

## 2012-11-08 DIAGNOSIS — I4891 Unspecified atrial fibrillation: Secondary | ICD-10-CM | POA: Insufficient documentation

## 2012-11-16 ENCOUNTER — Ambulatory Visit (INDEPENDENT_AMBULATORY_CARE_PROVIDER_SITE_OTHER): Payer: Medicare Other | Admitting: Cardiology

## 2012-11-16 ENCOUNTER — Encounter: Payer: Self-pay | Admitting: Cardiology

## 2012-11-16 VITALS — BP 120/60 | HR 92 | Ht 66.0 in | Wt 117.0 lb

## 2012-11-16 DIAGNOSIS — I441 Atrioventricular block, second degree: Secondary | ICD-10-CM

## 2012-11-16 DIAGNOSIS — I509 Heart failure, unspecified: Secondary | ICD-10-CM

## 2012-11-16 DIAGNOSIS — I10 Essential (primary) hypertension: Secondary | ICD-10-CM

## 2012-11-16 DIAGNOSIS — I519 Heart disease, unspecified: Secondary | ICD-10-CM

## 2012-11-16 DIAGNOSIS — I5189 Other ill-defined heart diseases: Secondary | ICD-10-CM | POA: Insufficient documentation

## 2012-11-16 DIAGNOSIS — I4891 Unspecified atrial fibrillation: Secondary | ICD-10-CM

## 2012-11-16 DIAGNOSIS — I5032 Chronic diastolic (congestive) heart failure: Secondary | ICD-10-CM | POA: Insufficient documentation

## 2012-11-16 DIAGNOSIS — I359 Nonrheumatic aortic valve disorder, unspecified: Secondary | ICD-10-CM

## 2012-11-16 DIAGNOSIS — Z950 Presence of cardiac pacemaker: Secondary | ICD-10-CM

## 2012-11-16 NOTE — Progress Notes (Signed)
[address] [place], [place]  [address] Phone: [phone] Fax:  [phone]  Date:  11/16/2012   ID:  [name], DOB 1926/11/05, MRN [id number]  PCP:  Lucilla Edin, MD  Cardiologist:  Armanda Magic, MD     History of Present Illness: [name] is a 79 y.o. female with a history of second degree type II AV block s/p PPM, HTN, PAF and diastolic dysfunction presents for followup.  She is doing well.  She denies any chest pain, SOB, DOE, LE edema, dizziness, palpitations or syncope.   Wt Readings from Last 3 Encounters:  10/27/12 130 lb (58.968 kg)  07/21/12 147 lb 3.2 oz (66.769 kg)  04/20/12 156 lb (70.761 kg)     Past Medical History  Diagnosis Date  . Dementia     "very mild"  . Hypothyroidism   . Dyslipidemia   . Goiter   . HTN (hypertension)   . Anxiety   . Dementia   . Hyperlipidemia   . Goiter   . Gait disorder   . Heart murmur     2D echo with mild AI, mild MR, mild TR EF 70%, diastolid dysfunction echo 10/2006  . Dysrhythmia   . Atrial fibrillation     not on anticoagulation due to fall risk and dementia  . RBBB   . Second degree AV block, Mobitz type II 05/2008    s/p PPM    Current Outpatient Prescriptions  Medication Sig Dispense Refill  . donepezil (ARICEPT) 10 MG tablet Take 1 tablet (10 mg total) by mouth at bedtime.  30 tablet  11  . furosemide (LASIX) 40 MG tablet Take 40 mg by mouth daily as needed. For fluid retention      . furosemide (LASIX) 40 MG tablet TAKE 1 TABLET BY MOUTH EVERY DAY AS NEEDED FOR FLUID RETENTION  30 tablet  2  . levothyroxine (SYNTHROID, LEVOTHROID) 75 MCG tablet Take 1 tablet (75 mcg total) by mouth daily.  30 tablet  5  . LORazepam (ATIVAN) 0.5 MG tablet Take 1 tablet (0.5 mg total) by mouth 2 (two) times daily as needed for anxiety.  60 tablet  1  . potassium chloride SA (K-DUR,KLOR-CON) 20 MEQ tablet Take 20 mEq by mouth daily as needed. Takes when taking Lasix      . rosuvastatin (CRESTOR) 10 MG  tablet Take 1 tablet (10 mg total) by mouth daily.  30 tablet  11  . Vitamin D, Ergocalciferol, (DRISDOL) 50000 UNITS CAPS Take 50,000 Units by mouth every 7 (seven) days. monday       No current facility-administered medications for this visit.    Allergies:    Allergies  Allergen Reactions  . Ivp Dye [Iodinated Diagnostic Agents] Anaphylaxis  . Shellfish Allergy Swelling and Other (See Comments)    EYES AND LIPS SWELL & SYNCOPE    Social History:  The patient  reports that she has never smoked. She has never used smokeless tobacco. She reports that she does not drink alcohol or use illicit drugs.   Family History:  The patient's family history includes Cancer in her brother; Heart disease in her son.   ROS:  Please see the history of present illness.      All other systems reviewed and negative.   PHYSICAL EXAM: VS:  There were no vitals taken for this visit. Well nourished, well developed, in no acute distress HEENT: normal Neck: no JVD Cardiac:  normal S1, S2; RRR;  no murmur Lungs:  clear to auscultation bilaterally, no wheezing, rhonchi or rales Abd: soft, nontender, no hepatomegaly Ext: no edema Skin: warm and dry Neuro:  CNs 2-12 intact, no focal abnormalities noted       ASSESSMENT AND PLAN:  1. Paroxysmal atrial fibrillation not on systemic anticoagulation due to dementia and fall risk.  She is well rate controlled. 2. HTN 3. Second degree AV block s/p PPM followed in our office 4. Diastolic dysfunction with chronic diastolic CHF  - continue Lasix  Followup with me in 6 months  Signed, Armanda Magic, MD 11/16/2012 4:43 PM

## 2012-11-16 NOTE — Patient Instructions (Addendum)
Your physician recommends that you schedule a follow-up appointment in: 3 months with Dr. Mayford Knife. Ok to do December so you can schedule today at check out.   Your physician recommends that you continue on your current medications as directed. Please refer to the Current Medication list given to you today.  Your physician has requested that you have an echocardiogram. Echocardiography is a painless test that uses sound waves to create images of your heart. It provides your doctor with information about the size and shape of your heart and how well your heart's chambers and valves are working. This procedure takes approximately one hour. There are no restrictions for this procedure.

## 2012-11-18 ENCOUNTER — Other Ambulatory Visit: Payer: Self-pay | Admitting: General Surgery

## 2012-11-18 DIAGNOSIS — Z79899 Other long term (current) drug therapy: Secondary | ICD-10-CM

## 2012-12-08 ENCOUNTER — Ambulatory Visit: Payer: Medicare Other | Admitting: Emergency Medicine

## 2012-12-14 ENCOUNTER — Ambulatory Visit (HOSPITAL_COMMUNITY): Payer: Medicare Other | Attending: Cardiology | Admitting: Radiology

## 2012-12-14 DIAGNOSIS — I10 Essential (primary) hypertension: Secondary | ICD-10-CM | POA: Insufficient documentation

## 2012-12-14 DIAGNOSIS — E785 Hyperlipidemia, unspecified: Secondary | ICD-10-CM | POA: Insufficient documentation

## 2012-12-14 DIAGNOSIS — I4891 Unspecified atrial fibrillation: Secondary | ICD-10-CM | POA: Insufficient documentation

## 2012-12-14 DIAGNOSIS — I359 Nonrheumatic aortic valve disorder, unspecified: Secondary | ICD-10-CM

## 2012-12-14 DIAGNOSIS — R011 Cardiac murmur, unspecified: Secondary | ICD-10-CM | POA: Insufficient documentation

## 2012-12-14 DIAGNOSIS — I509 Heart failure, unspecified: Secondary | ICD-10-CM | POA: Insufficient documentation

## 2012-12-14 DIAGNOSIS — I441 Atrioventricular block, second degree: Secondary | ICD-10-CM | POA: Insufficient documentation

## 2012-12-14 DIAGNOSIS — I4510 Unspecified right bundle-branch block: Secondary | ICD-10-CM | POA: Insufficient documentation

## 2012-12-14 NOTE — Progress Notes (Signed)
Echocardiogram performed.  

## 2012-12-15 ENCOUNTER — Ambulatory Visit: Payer: Medicare Other | Admitting: Emergency Medicine

## 2012-12-15 ENCOUNTER — Encounter (INDEPENDENT_AMBULATORY_CARE_PROVIDER_SITE_OTHER): Payer: Medicare Other

## 2012-12-15 DIAGNOSIS — R0609 Other forms of dyspnea: Secondary | ICD-10-CM

## 2013-01-18 ENCOUNTER — Encounter: Payer: Self-pay | Admitting: Cardiology

## 2013-01-28 ENCOUNTER — Ambulatory Visit: Payer: Medicare Other | Admitting: Cardiology

## 2013-05-03 ENCOUNTER — Other Ambulatory Visit: Payer: Self-pay | Admitting: Emergency Medicine

## 2013-05-07 ENCOUNTER — Telehealth: Payer: Self-pay

## 2013-05-07 NOTE — Telephone Encounter (Signed)
Spoke to son, I explained his mother would need an OV due to last OV being 10/27/12. He understood, and stated he would try to bring her in on Saturday with the forms to be completed.

## 2013-05-07 NOTE — Telephone Encounter (Signed)
Pt's son Heywood Beneony Smothers, would like a call regarding getting assisted living for his mother. He would like to know if you could possibly complete a FL2 form for medicaid so she can get assistance with paying for assisted living. He also adds that pts caregiver (his sister Lura Ematsy) is currently in the hospital for her heart and is unable to care for her anymore. Best# [phone] or [phone] (cell)

## 2013-05-08 ENCOUNTER — Emergency Department (HOSPITAL_COMMUNITY)
Admission: EM | Admit: 2013-05-08 | Discharge: 2013-05-08 | Disposition: A | Discharge status: Home / Self Care | Payer: Medicare HMO | Attending: Emergency Medicine | Admitting: Emergency Medicine

## 2013-05-08 ENCOUNTER — Emergency Department (HOSPITAL_COMMUNITY): Payer: Medicare HMO

## 2013-05-08 ENCOUNTER — Ambulatory Visit (INDEPENDENT_AMBULATORY_CARE_PROVIDER_SITE_OTHER): Payer: Medicare Other | Admitting: Emergency Medicine

## 2013-05-08 VITALS — BP 126/74 | HR 76 | Temp 98.0°F | Resp 17 | Ht 64.0 in | Wt 135.0 lb

## 2013-05-08 DIAGNOSIS — F29 Unspecified psychosis not due to a substance or known physiological condition: Secondary | ICD-10-CM | POA: Insufficient documentation

## 2013-05-08 DIAGNOSIS — F0391 Unspecified dementia with behavioral disturbance: Secondary | ICD-10-CM | POA: Insufficient documentation

## 2013-05-08 DIAGNOSIS — R4182 Altered mental status, unspecified: Secondary | ICD-10-CM

## 2013-05-08 DIAGNOSIS — I10 Essential (primary) hypertension: Secondary | ICD-10-CM | POA: Insufficient documentation

## 2013-05-08 DIAGNOSIS — Z950 Presence of cardiac pacemaker: Secondary | ICD-10-CM | POA: Insufficient documentation

## 2013-05-08 DIAGNOSIS — R112 Nausea with vomiting, unspecified: Secondary | ICD-10-CM | POA: Insufficient documentation

## 2013-05-08 DIAGNOSIS — F0390 Unspecified dementia without behavioral disturbance: Secondary | ICD-10-CM

## 2013-05-08 DIAGNOSIS — I499 Cardiac arrhythmia, unspecified: Secondary | ICD-10-CM | POA: Insufficient documentation

## 2013-05-08 DIAGNOSIS — R32 Unspecified urinary incontinence: Secondary | ICD-10-CM | POA: Insufficient documentation

## 2013-05-08 DIAGNOSIS — Z79899 Other long term (current) drug therapy: Secondary | ICD-10-CM | POA: Insufficient documentation

## 2013-05-08 DIAGNOSIS — F03918 Unspecified dementia, unspecified severity, with other behavioral disturbance: Secondary | ICD-10-CM | POA: Insufficient documentation

## 2013-05-08 DIAGNOSIS — R011 Cardiac murmur, unspecified: Secondary | ICD-10-CM | POA: Insufficient documentation

## 2013-05-08 DIAGNOSIS — R531 Weakness: Secondary | ICD-10-CM

## 2013-05-08 DIAGNOSIS — E039 Hypothyroidism, unspecified: Secondary | ICD-10-CM | POA: Insufficient documentation

## 2013-05-08 DIAGNOSIS — R269 Unspecified abnormalities of gait and mobility: Secondary | ICD-10-CM

## 2013-05-08 DIAGNOSIS — E049 Nontoxic goiter, unspecified: Secondary | ICD-10-CM | POA: Insufficient documentation

## 2013-05-08 DIAGNOSIS — F411 Generalized anxiety disorder: Secondary | ICD-10-CM | POA: Insufficient documentation

## 2013-05-08 DIAGNOSIS — E785 Hyperlipidemia, unspecified: Secondary | ICD-10-CM | POA: Insufficient documentation

## 2013-05-08 LAB — I-STAT TROPONIN, ED: Troponin i, poc: 0.02 ng/mL (ref 0.00–0.08)

## 2013-05-08 LAB — CBC WITH DIFFERENTIAL/PLATELET
BASOS ABS: 0 10*3/uL (ref 0.0–0.1)
Basophils Relative: 0 % (ref 0–1)
EOS ABS: 0.1 10*3/uL (ref 0.0–0.7)
Eosinophils Relative: 1 % (ref 0–5)
HCT: 39 % (ref 36.0–46.0)
Hemoglobin: 13.2 g/dL (ref 12.0–15.0)
Lymphocytes Relative: 20 % (ref 12–46)
Lymphs Abs: 1.1 10*3/uL (ref 0.7–4.0)
MCH: 33.1 pg (ref 26.0–34.0)
MCHC: 33.8 g/dL (ref 30.0–36.0)
MCV: 97.7 fL (ref 78.0–100.0)
Monocytes Absolute: 0.5 10*3/uL (ref 0.1–1.0)
Monocytes Relative: 8 % (ref 3–12)
Neutro Abs: 4.1 10*3/uL (ref 1.7–7.7)
Neutrophils Relative %: 71 % (ref 43–77)
PLATELETS: 174 10*3/uL (ref 150–400)
RBC: 3.99 MIL/uL (ref 3.87–5.11)
RDW: 14.5 % (ref 11.5–15.5)
WBC: 5.8 10*3/uL (ref 4.0–10.5)

## 2013-05-08 LAB — COMPREHENSIVE METABOLIC PANEL
ALBUMIN: 3.4 g/dL — AB (ref 3.5–5.2)
ALK PHOS: 53 U/L (ref 39–117)
ALT: 24 U/L (ref 0–35)
AST: 44 U/L — ABNORMAL HIGH (ref 0–37)
BUN: 17 mg/dL (ref 6–23)
CO2: 28 mEq/L (ref 19–32)
Calcium: 8.7 mg/dL (ref 8.4–10.5)
Chloride: 98 mEq/L (ref 96–112)
Creatinine, Ser: 1.04 mg/dL (ref 0.50–1.10)
GFR calc Af Amer: 56 mL/min — ABNORMAL LOW (ref >90)
GFR calc non Af Amer: 48 mL/min — ABNORMAL LOW (ref >90)
Glucose, Bld: 73 mg/dL (ref 70–99)
Potassium: 4.3 mEq/L (ref 3.7–5.3)
Sodium: 139 mEq/L (ref 137–147)
TOTAL PROTEIN: 6.8 g/dL (ref 6.0–8.3)
Total Bilirubin: 0.7 mg/dL (ref 0.3–1.2)

## 2013-05-08 LAB — POCT CBC
Granulocyte percent: 69.6 %G (ref 37–80)
HCT, POC: 41.4 % (ref 37.7–47.9)
HEMOGLOBIN: 13.1 g/dL (ref 12.2–16.2)
Lymph, poc: 1.3 (ref 0.6–3.4)
MCH, POC: 32.2 pg — AB (ref 27–31.2)
MCHC: 31.6 g/dL — AB (ref 31.8–35.4)
MCV: 101.6 fL — AB (ref 80–97)
MID (CBC): 0.3 (ref 0–0.9)
MPV: 7 fL (ref 0–99.8)
PLATELET COUNT, POC: 229 10*3/uL (ref 142–424)
POC GRANULOCYTE: 3.8 (ref 2–6.9)
POC LYMPH PERCENT: 24.2 %L (ref 10–50)
POC MID %: 6.2 %M (ref 0–12)
RBC: 4.07 M/uL (ref 4.04–5.48)
RDW, POC: 14.3 %
WBC: 5.4 10*3/uL (ref 4.6–10.2)

## 2013-05-08 LAB — AMMONIA: AMMONIA: 10 umol/L — AB (ref 11–60)

## 2013-05-08 LAB — GLUCOSE, POCT (MANUAL RESULT ENTRY): POC Glucose: 72 mg/dl (ref 70–99)

## 2013-05-08 LAB — URINALYSIS, ROUTINE W REFLEX MICROSCOPIC
Bilirubin Urine: NEGATIVE
Glucose, UA: NEGATIVE mg/dL
Hgb urine dipstick: NEGATIVE
Ketones, ur: NEGATIVE mg/dL
Leukocytes, UA: NEGATIVE
Nitrite: NEGATIVE
Protein, ur: NEGATIVE mg/dL
Specific Gravity, Urine: 1.013 (ref 1.005–1.030)
Urobilinogen, UA: 1 mg/dL (ref 0.0–1.0)
pH: 7.5 (ref 5.0–8.0)

## 2013-05-08 NOTE — Progress Notes (Addendum)
This chart was scribed for Viviann SpareSteven A. Cleta Albertsaub, MD by Ashley JacobsBrittany Andrews, [facility] Scribe. The patient was seen in room and the patient's care was started at 4:07 PM.   Emesis  Associated symptoms include dizziness.  Dizziness Associated symptoms include vomiting and weakness.  Foot Injury    HPI Comments: [name] is a 80 y.o. female whose son arrives to the [facility] complaining of pt's increased generalized weakness for the past two days. Pt has the associated symptoms of nausea, vomiting and unsteady gait. She complains of feeling sick at night. Per son, her dementia is getting drastically worse and she often tries to get out of bed at night. She has increased urinary and bowel incontinence. Pt typically wears Depends at night. Denies dysuria. Denies changes to her appetite. Denies eating new or  different food recently. Pt has bilateral foot swelling and her feet are cool. Pt is confused and unable to recall the year or date. She is currently taking Donepezil HCL 10 mg, Amiodarone HCL 200 mg, and Levothyroxine 75 mcg.  [place]  His sister, [name] is her caregiver.   Review of Systems  Cardiovascular: Positive for orthopnea and leg swelling.  Gastrointestinal: Positive for vomiting.  Genitourinary: Negative for dysuria.       Incontinence   Neurological: Positive for dizziness and weakness.  Psychiatric/Behavioral: Positive for memory loss.    Physical Exam HEENT exam reveals pupils reactive neck supple Chest was clear to auscultation and percussion.  Heart rhythm appeared regular today. Abdomen not distended there is a prominent pulsation left midabdomen Extremities old feet are somewhat cold. Pulses were not palpable. Capillary fill was 4-5 seconds    Filed Vitals:   05/08/13 1558  BP: 126/74  Pulse: 76  Temp: 98 F (36.7 C)  Resp: 17    Assessment  COORDINATION OF CARE:  4:07 PM Discussed course of care  with pt's son which includes IV drugs and hospital admittance for evaluation . Pt's son understands and agrees. Patient presents with worsening mental status. She has been more confused than usual. She has had episodes of vomiting. Her gait has become extremely unstable to where she needs maximum assistance to get out of a chair. Memory has significantly altered recently   Plan Patient to be transferred to the hospital for evaluation. She may have an occult infection versus a CVA to explain her worsening mental status. She also has evidence of poor circulation to the lower extremities with very slow capillary filling and a questionable pulsatile area in the left midabdomen. This certainly also needs evaluation. Patient will be transferred to the hospital further evaluation. Of note her daughter who is the primary caregiver has not been caring for her because she has recently had a stroke and a myocardial infarction and has been in the hospital. There is also swelling noted over the left foot and patient needs to have this x-rayed  I personally performed the services described in this documentation, which was scribed in my presence. The recorded information has been reviewed and is accurate.

## 2013-05-08 NOTE — Discharge Instructions (Signed)
Alzheimer's Disease °Caregiver Guide °A person who has Alzheimer's disease may not be able to take care of himself or herself. He or she may need help with simple tasks. The tips below can help you care for the person. °MEMORY LOSS AND CONFUSION °If the person is confused or cannot remember things: °· Stay calm. °· Respond with a short answer. °· Avoid correcting him or her in a way that sounds like scolding. °· Try not to take it personally, even if he or she forgets your name. °BEHAVIOR CHANGES °The person may go through behavior changes. This can include depression, anxiety, anger, or seeing things that are not there. When behavior changes: °· Try not to take behavior changes personally. °· Stay calm and patient. °· Do not argue or try to convince the person about a specific point. °· Know that these changes are part of the disease process. Try to work through it. °TIPS TO LESSEN FRUSTRATION °· Make appointments and do daily tasks when the person is at his or her best. °· Take your time. Simple tasks may take longer. Allow plenty of time to complete tasks. °· Limit choices for the person. °· Involve the person in what you are doing. °· Stick to a routine. °· Avoid new or crowded places, if possible. °· Use simple words, short sentences, and a calm voice. Only give 1 direction at a time. °· Buy clothes and shoes that are easy to put on and take off. °· Let people help if they offer. °HOME SAFETY °· Keep floors clear. Remove rugs, magazine racks, and floor lamps. °· Keep hallways well lit. °· Put a handrail and nonslip mat in the bathtub or shower. °· Put childproof locks on cabinets that have dangerous items in them. These items include medicine, alcohol, guns, toxic cleaning items, sharp tools, matches, or lighters. °· Place locks on doors where the person cannot see or reach them. This helps the person to not wander out of the house and get lost. °· Be prepared for emergencies. Keep a list of emergency phone  numbers and addresses in a handy area. °PLANS FOR THE FUTURE  °· Talk about finances. °· Talk about money management. People with Alzheimer's disease have trouble managing their money as the disease gets worse. °· Get help from professional advisors about financial and legal matters. °· Talk about future care. °· Choose a power of attorney. This is someone who can make decisions for the person with Alzheimer's disease when he or she can no longer do so. °· Talk about driving and when it is the right time to stop. The person's doctor can help with this. °· If the person lives alone, make sure he or she is safe. Some people need extra help at home. Other people need more care at a nursing home or care center. °SUPPORT GROUPS  °Some benefits of joining a support group include:  °· Learning ways to manage stress. °· Sharing experiences with others. °· Getting emotional comfort and support. °· Learning new caregiving skills as the disease progresses. °· Knowing what community resources are available and taking advantage of them. °GET HELP IF: °· The person has a fever. °· The person has a sudden behavior change that does not get better with calming strategies. °· The person is unable to manage his or her living situation. °· The person threatens you or anyone else, including himself or herself. °· You are no longer able to care for the person. °Document Released: 04/22/2011 Document   Reviewed: 04/22/2011 °[employer]® Patient Information ©2014 [employer]. ° °

## 2013-05-08 NOTE — ED Notes (Signed)
Dr. Brtalik at the bedside.  

## 2013-05-08 NOTE — ED Provider Notes (Signed)
CSN: [id number]     Arrival date & time 05/08/13  1712 History   First MD Initiated Contact with Patient 05/08/13 1718     Chief Complaint  Patient presents with  . Altered Mental Status     (Consider location/radiation/quality/duration/timing/severity/associated sxs/prior Treatment) HPI Comments: 80 y.o. WF w/ PMhx of HLD, HTN, dementia w/ cc: of AMS. Pt with dementia, has been gradually woresning for a few months. Over past couple days has been slightly worse and per son she has also been having some nausea, and vomiting at times, increased urinary incontinence. Pt has no complaints but is disoriented. Denies chest pain, SOB, fever. Hx of dementia and has been getting worse. Saw PCP today who recommended coming to ED for evaluation and admission.  Patient is a 80 y.o. female presenting with altered mental status. The history is provided by the patient.  Altered Mental Status Presenting symptoms: behavior changes, confusion, disorientation and memory loss   Presenting symptoms: no partial responsiveness and no unresponsiveness   Severity:  Severe Most recent episode:  Today Episode history:  Continuous Timing:  Constant Progression:  Worsening Chronicity:  Chronic Context: dementia   Context: not alcohol use, not head injury, not a recent illness and not a recent infection   Associated symptoms: bladder incontinence and vomiting   Associated symptoms: no abdominal pain, no difficulty breathing, no fever, no headaches, no nausea, no palpitations and no rash     Past Medical History  Diagnosis Date  . Dementia     "very mild"  . Hypothyroidism   . Dyslipidemia   . Goiter   . HTN (hypertension)   . Anxiety   . Dementia   . Hyperlipidemia   . Goiter   . Gait disorder   . Dysrhythmia   . Atrial fibrillation     not on anticoagulation due to fall risk and dementia  . RBBB   . Second degree AV block, Mobitz type II 05/2008    s/p PPM  . Heart murmur     2D echo with mild  AI/AS, mild MR, mild TR EF 70%, diastolid dysfunction echo 10/2006   Past Surgical History  Procedure Laterality Date  . Pacemaker insertion    . Insert / replace / remove pacemaker    . Throidectomy    . Abdominal hysterectomy     Family History  Problem Relation Age of Onset  . Cancer Brother   . Heart disease Son    History  Substance Use Topics  . Smoking status: Never Smoker   . Smokeless tobacco: Never Used  . Alcohol Use: No     Comment: 1-2 monthly   OB History   Grav Para Term Preterm Abortions TAB SAB Ect Mult Living                 Review of Systems  Unable to perform ROS: Dementia  Constitutional: Negative for fever.  Cardiovascular: Negative for palpitations.  Gastrointestinal: Positive for vomiting. Negative for nausea and abdominal pain.  Genitourinary: Positive for bladder incontinence.  Skin: Negative for rash.  Neurological: Negative for headaches.  Psychiatric/Behavioral: Positive for memory loss and confusion.      Allergies  Ivp dye and Shellfish allergy  Home Medications   Current Outpatient Rx  Name  Route  Sig  Dispense  Refill  . amiodarone (PACERONE) 200 MG tablet   Oral   Take 200 mg by mouth daily.          Marland Kitchen donepezil (ARICEPT)  10 MG tablet   Oral   Take 1 tablet (10 mg total) by mouth at bedtime. PATIENT NEEDS OFFICE VISIT FOR ADDITIONAL REFILLS   30 tablet   0   . furosemide (LASIX) 40 MG tablet   Oral   Take 40 mg by mouth daily as needed. For fluid retention         . levothyroxine (SYNTHROID, LEVOTHROID) 75 MCG tablet   Oral   Take 1 tablet (75 mcg total) by mouth daily.   30 tablet   5   . LORazepam (ATIVAN) 0.5 MG tablet   Oral   Take 1 tablet (0.5 mg total) by mouth 2 (two) times daily as needed for anxiety.   60 tablet   1   . potassium chloride SA (K-DUR,KLOR-CON) 20 MEQ tablet   Oral   Take 20 mEq by mouth daily as needed. Takes when taking Lasix         . Vitamin D, Ergocalciferol, (DRISDOL)  50000 UNITS CAPS   Oral   Take 50,000 Units by mouth every 7 (seven) days. monday          BP 175/96  Pulse 59  Temp(Src) 97.2 F (36.2 C) (Oral)  Resp 16  SpO2 98% Physical Exam  Constitutional: She appears well-developed and well-nourished. No distress.  Elderly female resting, no complaints, pleasant  HENT:  Head: Normocephalic and atraumatic.  Mouth/Throat: Oropharynx is clear and moist.  Eyes: Conjunctivae and EOM are normal. Pupils are equal, round, and reactive to light. Right eye exhibits no discharge. Left eye exhibits no discharge. No scleral icterus.  Neck: Normal range of motion. Neck supple.  Cardiovascular: Normal rate, regular rhythm and intact distal pulses.  Exam reveals no gallop and no friction rub.   No murmur heard. Pulmonary/Chest: Effort normal and breath sounds normal. No respiratory distress. She has no wheezes. She has no rales.  Abdominal: Soft. She exhibits no distension and no mass. There is no tenderness.  Musculoskeletal: Normal range of motion.  Neurological: She is alert. No cranial nerve deficit. She exhibits normal muscle tone. Coordination normal.  orietned to place only  Skin: She is not diaphoretic.    ED Course  Procedures (including critical care time) Labs Review Labs Reviewed  COMPREHENSIVE METABOLIC PANEL - Abnormal; Notable for the following:    Albumin 3.4 (*)    AST 44 (*)    GFR calc non Af Amer 48 (*)    GFR calc Af Amer 56 (*)    All other components within normal limits  AMMONIA - Abnormal; Notable for the following:    Ammonia 10 (*)    All other components within normal limits  URINE CULTURE  CBC WITH DIFFERENTIAL  URINALYSIS, ROUTINE W REFLEX MICROSCOPIC  TSH  I-STAT TROPOININ, ED   Imaging Review Ct Head Wo Contrast  05/08/2013   CLINICAL DATA:  Altered mental status.  Possible fall.  EXAM: CT HEAD WITHOUT CONTRAST  TECHNIQUE: Contiguous axial images were obtained from the base of the skull through the vertex  without intravenous contrast.  COMPARISON:  CT HEAD W/O CM dated 04/05/2012; CT-SINUS dated 07/19/2005; CT HEAD W/O CM dated 09/22/2006  FINDINGS: The brainstem, cerebellum, cerebral peduncles, thalamus, basal ganglia, basilar cisterns, and ventricular system appear within normal limits. Periventricular white matter and corona radiata hypodensities favor chronic ischemic microvascular white matter disease. No intracranial hemorrhage, mass lesion, or acute CVA.  Several right maxillary periapical lucencies are present. Chronic left maxillary sinusitis.  IMPRESSION: 1. No  acute intracranial findings. 2. Several right maxillary periapical dental lucencies are present. Periapical abscess is not excluded. 3. Mild chronic left maxillary sinusitis. 4. Periventricular white matter and corona radiata hypodensities favor chronic ischemic microvascular white matter disease.   Electronically Signed   By: Herbie BaltimoreWalt  Liebkemann M.D.   On: 05/08/2013 18:25   Dg Chest Portable 1 View  05/08/2013   CLINICAL DATA:  Altered mental status.  EXAM: PORTABLE CHEST - 1 VIEW  COMPARISON:  04/05/2012  FINDINGS: No significant change in enlargement of the cardiac silhouette and prominence of the pulmonary vasculature and interstitial markings. The left subclavian pacemaker leads are unchanged. Diffuse osteopenia. Bilateral shoulder degenerative changes with superior migration of both humeral heads, compatible with large, chronic bilateral rotator cuff tears.  IMPRESSION: 1. No acute abnormality. 2. Stable cardiomegaly, pulmonary vascular congestion and chronic interstitial lung disease.   Electronically Signed   By: Gordan PaymentSteve  Reid M.D.   On: 05/08/2013 18:11     EKG Interpretation   Date/Time:  Saturday May 08 2013 17:43:16 EDT Ventricular Rate:  66 PR Interval:  245 QRS Duration: 143 QT Interval:  478 QTC Calculation: 501 R Axis:   -83 Text Interpretation:  Sinus rhythm Prolonged PR interval RBBB and LAFB  Probable left ventricular  hypertrophy since last ECG, PR interval has  lengthened Abnormal ekg Confirmed by [facility]  MD, MICHEAL ([id number]) on  05/08/2013 5:50:53 PM      MDM   MDM: 80 y.o. WF w/ PMHx of Dementia, hypothyroidism, HLD, HTN, Afib w/ cc: of worsening mental status. Pt with hx of dementia, lives with daughter, not oriented at baseline. Per son, patient has been gradually getting worse over a year, but for past week has had some waekness, nausea, increased incontinence, but is now getting better. No fever, chest pani, SOB. Pt is pleasant, can communicate basic conversation and has no complaints. AFVSS, well aappearing, non focal exam. Not oriented. Labs, urine, CXR, Head CT without abnormality. Suspect patient has worsening dementia. Son is very reasonable at bedside and wishes to explore placement options. Pt with no acute medical necessitation for admission. Pt has duaghter who can take care of patient.Son wished to speak to social work but with weekend, cannot call them at this time as unavailable. Pt has PCP and recommend follow up with PCP regarding placement issues. No acute process causing worsening Mental Status. Discharged. Care of case d/w my attending.  Final diagnoses:  Dementia  Weakness    Discharged  Pilar Jarvisoug Ivanka Kirshner, MD 05/09/13 0005

## 2013-05-08 NOTE — ED Notes (Signed)
Son took patient to urgent care who sent patient to the ED for further evaluation of increased AMS for a couple of days.  IV started by urgent care right wrist given 600 ml bolus 0.9 normal. Upon arrival alert ask same questions  Where am I? and Where is her family?  Follow commands appropriate.

## 2013-05-09 ENCOUNTER — Telehealth: Payer: Self-pay | Admitting: Emergency Medicine

## 2013-05-09 LAB — TSH: TSH: 80.203 u[IU]/mL — ABNORMAL HIGH (ref 0.350–4.500)

## 2013-05-09 NOTE — Telephone Encounter (Signed)
The patient's son, Heywood Beneony Smothers, called regarding his mother, [name].  Mr. Glenetta BorgSmothers related a very difficult night with his mother last night due to sleep issues related to her dementia.  The patient's son stated that she was up and down most of the night and did not fall asleep until around 3am.  The patient's son is wanting to discuss some options for sleep medications for tonight and future.  The patient's son is very concerned due to the sleep issues.  Please return call to patient's son at [phone]

## 2013-05-10 LAB — URINE CULTURE
COLONY COUNT: NO GROWTH
Culture: NO GROWTH

## 2013-05-10 NOTE — Telephone Encounter (Signed)
Spoke to son and advised to try melatonin. He understands and will call if he needs anything further.

## 2013-05-10 NOTE — ED Provider Notes (Signed)
I saw and evaluated the patient, reviewed the resident's note and I agree with the findings and plan.   EKG Interpretation   Date/Time:  Saturday May 08 2013 17:43:16 EDT Ventricular Rate:  66 PR Interval:  245 QRS Duration: 143 QT Interval:  478 QTC Calculation: 501 R Axis:   -83 Text Interpretation:  Sinus rhythm Prolonged PR interval RBBB and LAFB  Probable left ventricular hypertrophy since last ECG, PR interval has  lengthened Abnormal ekg Confirmed by [facility]  MD, Russella DarMICHEAL ([id number]) on  05/08/2013 5:50:53 PM      Pt with dementia, worsening per son who is from out of town, hasn't seen in several months.  Pt normally lives with a daughter who has also been sick and recently hospitalized which may have contributed to pt's decline.  No acute findings discovered for sig change in mentation.  Pt is awake, alert, talkative, can make needs known to me, denies any pain or disocomfort.  CT, CXR, UA, labs are unremarkable.  Pt needs follow up with PCP and can have social work follow up with pt and/or family for resources.     Gavin PoundMichael Y. Oletta LamasGhim, MD 05/10/13 [phone]

## 2013-05-10 NOTE — Telephone Encounter (Signed)
Please start with melatonin 2 mg at bedtime. . Please make a referral to home health for a social worker evaluation to evaluate the home situation due to worsening dementia. Pelvis and we are making a referral for home health to come out and see what we can do regarding getting more care for [name].

## 2013-05-10 NOTE — Telephone Encounter (Signed)
Pt was in 3/28- this was not discussed at the time. Should we have her come in for an OV or do you want to prescribe something for sleep?

## 2013-05-11 NOTE — Telephone Encounter (Signed)
Pt's son [name], would like to speak with Dr.Daub regarding medication adjustment for her dementia medication.  Best# [phone]

## 2013-05-12 ENCOUNTER — Telehealth: Payer: Self-pay

## 2013-05-12 NOTE — Telephone Encounter (Signed)
Patient son [name] called on behalf of his mother [name]. [name] stated his mother seem Dr. Cleta Albertsaub on Saturday and was sent out my EMS. [name] stated they are fighting through Dementia and he is requesting to make adjustments to his mothers medication. He checked into an memory care unit for his mother. [name] is having a really hard time with his mother at home. Please call [name] at [phone].

## 2013-05-13 ENCOUNTER — Ambulatory Visit (INDEPENDENT_AMBULATORY_CARE_PROVIDER_SITE_OTHER): Payer: Managed Care, Other (non HMO) | Admitting: Emergency Medicine

## 2013-05-13 VITALS — BP 140/84 | HR 65 | Temp 97.9°F | Resp 16

## 2013-05-13 DIAGNOSIS — R112 Nausea with vomiting, unspecified: Secondary | ICD-10-CM

## 2013-05-13 DIAGNOSIS — R269 Unspecified abnormalities of gait and mobility: Secondary | ICD-10-CM

## 2013-05-13 DIAGNOSIS — I739 Peripheral vascular disease, unspecified: Secondary | ICD-10-CM

## 2013-05-13 DIAGNOSIS — F0390 Unspecified dementia without behavioral disturbance: Secondary | ICD-10-CM

## 2013-05-13 MED ORDER — ONDANSETRON HCL 4 MG PO TABS: 4.0000 mg | ORAL_TABLET | Freq: Three times a day (TID) | ORAL | Status: AC | PRN

## 2013-05-13 NOTE — Progress Notes (Signed)
This chart was scribed for Viviann SpareSteven A. Cleta Albertsaub, MD by Ashley JacobsBrittany Andrews, [facility] Scribe. The patient was seen in room and the patient's care was started at 5:52 PM.   Emesis  Associated symptoms include myalgias. Pertinent negatives include no abdominal pain.  Extremity Weakness    HPI Comments: [name] is a 80 y.o. female with a hx of Alzheimer's who [facility] to the [facility] for progressively worsening dementia. Pt was seen two days ago and was sent to the hospital. She had laboratory tests performed which did not reveal any complications or signs of dehydration. Pt's son mentions that he complains that she feels terrible. Pt has intermittent emesis, onset of five days ago.  Pt's son complains that her gait has gotten worse and she dependently ambulates in a wheel chair. Pt complains of bilateral foot pain. Per son, she is afraid to walk.  She is agitated and her son would like to adjust her medication.Denies abdominal pain. Pt has constant nausea. Her son is looking into long term care at assisted living facility.  Pt smoked when she was a teenager but she denies smoking most of her lifetime.  Review of Systems  Gastrointestinal: Positive for nausea and vomiting. Negative for abdominal pain.  Musculoskeletal: Positive for extremity weakness, joint pain and myalgias. Negative for falls.  Neurological: Positive for weakness.  Psychiatric/Behavioral: Positive for memory loss.    Physical Exam Patient is alert and cooperative but ask the same questions over and over again. She does not have any focal neurological signs. Her chest was clear her heart exam reveals a 2/6 harsh systolic murmur at the base of the heart. Abdomen is soft nontender extremity exam reveals both feet to be cold there is a 4 second capillary fill on the left 4 second on the right.  Filed Vitals:   05/13/13 1732  BP: 140/84  Pulse: 65  Temp: 97.9 F (36.6 C)  Resp: 16   No  orders of the defined types were placed in this encounter.   Assessment DIAGNOSTIC STUDIES:    COORDINATION OF CARE:  5:52 PM Discussed course of care with pt . Pt understands and agrees.    Plan Try some Zofran for nausea. I did hold off on anxiety drugs. She will take melatonin at night to sleep. She will have care in the home for 10 hours a day. Her daughter recently had a stroke and heart attack in her son who has been staying with her has to go back to [place] home in [place]. We'll schedule arterial Dopplers of both legs. Referral also made for physical therapy in the home.

## 2013-05-13 NOTE — Telephone Encounter (Signed)
Pts son called today regarding his mothers dementia, recent hospital visit and medication management. Pts son Alinda Moneyony states he is here from [place] sorting through lots of information and trying to figure out some temporary to permanent care plans for his mother. He states he is leaving to go back to [place] on Saturday and wanted to speak to Dr Cleta Albertsaub before then. I advised him of Dr Deforest Hoylesaubs hours tonight (5-close) and he said he will bring her tonight and see Dr Cleta Albertsaub.   Bf

## 2013-05-27 ENCOUNTER — Telehealth: Payer: Self-pay

## 2013-05-27 NOTE — Telephone Encounter (Signed)
PA needed on Zofran. The first notice I received was today. Called pharm bc Rx was written at OV on 05/13/13. Pharm advised pt never p/up and their system shows they have sent out PA reqs before today, but we have not received. Pharm will put on hold unless she hears back from me or pt's family that it is needed. Called daughter who stated that pt has not had any problems w/emesis/nausea since her OV and does not need this, but will call if she worsens again.

## 2013-05-27 NOTE — Telephone Encounter (Signed)
PA needed for Crestor 10. Completed form on covermymeds.

## 2013-06-02 NOTE — Telephone Encounter (Signed)
I do not have any ppw or record in covermymeds for this PA. Called pharm and they do not have a current Rx for pt and have not req'd PA. Crestor is not on pt's med list from 05/2013 OV. I will wait until/if this is needed to f/up further.

## 2013-06-05 ENCOUNTER — Other Ambulatory Visit: Payer: Self-pay | Admitting: Emergency Medicine

## 2013-08-12 ENCOUNTER — Encounter: Payer: Self-pay | Admitting: Emergency Medicine

## 2013-08-12 ENCOUNTER — Ambulatory Visit (INDEPENDENT_AMBULATORY_CARE_PROVIDER_SITE_OTHER): Payer: Managed Care, Other (non HMO) | Admitting: Emergency Medicine

## 2013-08-12 VITALS — BP 110/66 | HR 62 | Temp 97.5°F | Resp 16 | Wt 146.8 lb

## 2013-08-12 DIAGNOSIS — I519 Heart disease, unspecified: Secondary | ICD-10-CM

## 2013-08-12 DIAGNOSIS — F0390 Unspecified dementia without behavioral disturbance: Secondary | ICD-10-CM

## 2013-08-12 DIAGNOSIS — E039 Hypothyroidism, unspecified: Secondary | ICD-10-CM

## 2013-08-12 MED ORDER — POTASSIUM CHLORIDE CRYS ER 20 MEQ PO TBCR: 20.0000 meq | EXTENDED_RELEASE_TABLET | Freq: Every day | ORAL | Status: AC | PRN

## 2013-08-12 MED ORDER — LEVOTHYROXINE SODIUM 75 MCG PO TABS: 75.0000 ug | ORAL_TABLET | Freq: Every day | ORAL | Status: DC

## 2013-08-12 MED ORDER — DONEPEZIL HCL 10 MG PO TABS: 10.0000 mg | ORAL_TABLET | Freq: Every day | ORAL | Status: AC

## 2013-08-12 MED ORDER — FUROSEMIDE 40 MG PO TABS: 40.0000 mg | ORAL_TABLET | Freq: Every day | ORAL | Status: AC | PRN

## 2013-08-12 MED ORDER — PAROXETINE HCL 10 MG PO TABS: 10.0000 mg | ORAL_TABLET | Freq: Every day | ORAL | Status: DC

## 2013-08-12 MED ORDER — DONEPEZIL HCL 10 MG PO TABS: 10.0000 mg | ORAL_TABLET | Freq: Every day | ORAL | Status: DC

## 2013-08-12 MED ORDER — VITAMIN D (ERGOCALCIFEROL) 1.25 MG (50000 UNIT) PO CAPS: 50000.0000 [IU] | ORAL_CAPSULE | ORAL | Status: AC

## 2013-08-12 MED ORDER — POTASSIUM CHLORIDE CRYS ER 20 MEQ PO TBCR: 20.0000 meq | EXTENDED_RELEASE_TABLET | Freq: Every day | ORAL | Status: DC | PRN

## 2013-08-12 NOTE — Progress Notes (Signed)
Subjective:  This chart was scribed for Collene GobbleSteven A Desirai Traxler, MD by Charline BillsEssence Howell, ED Scribe. The patient was seen in room 21. Patient's care was started at 12:01 PM.   Patient ID: [name], female    DOB: 1927/01/22, 80 y.o.   MRN: [id number]  Chief Complaint  Patient presents with  . Medication Refill   HPI HPI Comments: [name] is a 80 y.o. female, with a h/o dementia, who presents to the [facility] for a follow-up. Pt has taken all of her medications as prescribed. Pt recently had a physical therapist, Ruel FavorsSabra, who came every Tuesday and Thursday; pt states that she loved this physical therapist. Pt's daughter reports increased strength in upper and lower extremities. Daughter reports that pt's attention span is similar to that of a toddler's. She also reports repetition of 4 questions throughout the day and difficulty reading. Pt's daughter wants to discuss starting the pt on Paxil. Pt's daughter also reports unsteady gait with associated falls. Pt's daughter states that she is currently seeking a day care center for the pt.   Pt's daughter, primary caregiver, had a stroke and heart attack approximately 1 month ago. She sees a cardiologist and neurologist next week. Daughter states that she has no help with the pt; she will try to connect with a support group.   Past Medical History  Diagnosis Date  . Dementia     "very mild"  . Hypothyroidism   . Dyslipidemia   . Goiter   . HTN (hypertension)   . Anxiety   . Dementia   . Hyperlipidemia   . Goiter   . Gait disorder   . Dysrhythmia   . Atrial fibrillation     not on anticoagulation due to fall risk and dementia  . RBBB   . Second degree AV block, Mobitz type II 05/2008    s/p PPM  . Heart murmur     2D echo with mild AI/AS, mild MR, mild TR EF 70%, diastolid dysfunction echo 10/2006   Current Outpatient Prescriptions on File Prior to Visit  Medication Sig Dispense Refill  . amiodarone (PACERONE)  200 MG tablet Take 200 mg by mouth daily.       Marland Kitchen. donepezil (ARICEPT) 10 MG tablet Take 1 tablet (10 mg total) by mouth at bedtime.  30 tablet  5  . furosemide (LASIX) 40 MG tablet Take 40 mg by mouth daily as needed. For fluid retention      . levothyroxine (SYNTHROID, LEVOTHROID) 75 MCG tablet Take 1 tablet (75 mcg total) by mouth daily.  30 tablet  5  . LORazepam (ATIVAN) 0.5 MG tablet Take 1 tablet (0.5 mg total) by mouth 2 (two) times daily as needed for anxiety.  60 tablet  1  . ondansetron (ZOFRAN) 4 MG tablet Take 1 tablet (4 mg total) by mouth every 8 (eight) hours as needed for nausea or vomiting.  20 tablet  0  . potassium chloride SA (K-DUR,KLOR-CON) 20 MEQ tablet Take 20 mEq by mouth daily as needed. Takes when taking Lasix      . Vitamin D, Ergocalciferol, (DRISDOL) 50000 UNITS CAPS Take 50,000 Units by mouth every 7 (seven) days. monday       No current facility-administered medications on file prior to visit.   Allergies  Allergen Reactions  . Ivp Dye [Iodinated Diagnostic Agents] Anaphylaxis  . Shellfish Allergy Swelling and Other (See Comments)    EYES AND LIPS SWELL & SYNCOPE  Review of Systems  Neurological: Positive for weakness.  Psychiatric/Behavioral: Positive for decreased concentration.      Objective:   Physical Exam CONSTITUTIONAL: Frail elderly female with difficulty hearing HEAD: Normocephalic/atraumatic EYES: EOMI/PERRL ENMT: Mucous membranes moist NECK: supple no meningeal signs SPINE:entire spine nontender CV: S1/S2 noted, pacemaker in with RRR LUNGS: Lungs are clear to auscultation bilaterally, no apparent distress ABDOMEN: soft, nontender, no rebound or guarding GU:no cva tenderness NEURO: Pt is awake/alert, moves all extremitiesx4 patient is slow to answer questions and does continue to repeat herself and asked the same questions over and over again to EXTREMITIES: pulses normal, 2+ edema L leg, 1+ edema R leg SKIN: warm, color normal PSYCH:  no abnormalities of mood noted     Assessment & Plan:  Her dementia is progressive. She continues to have anger outbursts with her daughter. Her daughter requested that we try low dose of Paxil and I think this is appropriate. She really needs to be in a care facility but apparently financially they cannot afford it I personally performed the services described in this documentation, which was scribed in my presence. The recorded information has been reviewed and is accurate.

## 2013-08-13 ENCOUNTER — Other Ambulatory Visit: Payer: Self-pay | Admitting: Emergency Medicine

## 2013-08-13 DIAGNOSIS — E039 Hypothyroidism, unspecified: Secondary | ICD-10-CM

## 2013-08-13 LAB — CBC WITH DIFFERENTIAL/PLATELET
Basophils Absolute: 0 10*3/uL (ref 0.0–0.1)
Basophils Relative: 0 % (ref 0–1)
EOS PCT: 1 % (ref 0–5)
Eosinophils Absolute: 0.1 10*3/uL (ref 0.0–0.7)
HEMATOCRIT: 37.6 % (ref 36.0–46.0)
HEMOGLOBIN: 13.2 g/dL (ref 12.0–15.0)
LYMPHS ABS: 1.7 10*3/uL (ref 0.7–4.0)
LYMPHS PCT: 26 % (ref 12–46)
MCH: 33.4 pg (ref 26.0–34.0)
MCHC: 35.1 g/dL (ref 30.0–36.0)
MCV: 95.2 fL (ref 78.0–100.0)
MONO ABS: 0.4 10*3/uL (ref 0.1–1.0)
MONOS PCT: 6 % (ref 3–12)
Neutro Abs: 4.4 10*3/uL (ref 1.7–7.7)
Neutrophils Relative %: 67 % (ref 43–77)
Platelets: 183 10*3/uL (ref 150–400)
RBC: 3.95 MIL/uL (ref 3.87–5.11)
RDW: 14.8 % (ref 11.5–15.5)
WBC: 6.6 10*3/uL (ref 4.0–10.5)

## 2013-08-13 LAB — BASIC METABOLIC PANEL WITH GFR
BUN: 23 mg/dL (ref 6–23)
CHLORIDE: 104 meq/L (ref 96–112)
CO2: 25 mEq/L (ref 19–32)
CREATININE: 1.17 mg/dL — AB (ref 0.50–1.10)
Calcium: 7.6 mg/dL — ABNORMAL LOW (ref 8.4–10.5)
GFR, Est African American: 49 mL/min — ABNORMAL LOW
GFR, Est Non African American: 43 mL/min — ABNORMAL LOW
GLUCOSE: 75 mg/dL (ref 70–99)
Potassium: 5.6 mEq/L — ABNORMAL HIGH (ref 3.5–5.3)
Sodium: 139 mEq/L (ref 135–145)

## 2013-08-13 LAB — TSH: TSH: 95.405 u[IU]/mL — ABNORMAL HIGH (ref 0.350–4.500)

## 2013-08-13 LAB — T4, FREE: Free T4: 0.26 ng/dL — ABNORMAL LOW (ref 0.80–1.80)

## 2013-08-13 MED ORDER — LEVOTHYROXINE SODIUM 100 MCG PO TABS
100.0000 ug | ORAL_TABLET | Freq: Every day | ORAL | Status: AC
Start: 2013-08-13 — End: ?

## 2013-08-30 ENCOUNTER — Telehealth: Payer: Self-pay

## 2013-08-30 DIAGNOSIS — F0391 Unspecified dementia with behavioral disturbance: Secondary | ICD-10-CM

## 2013-08-30 DIAGNOSIS — F03918 Unspecified dementia, unspecified severity, with other behavioral disturbance: Secondary | ICD-10-CM

## 2013-08-30 NOTE — Addendum Note (Signed)
Addended by: Elease EtienneHANSEN, Liam Cammarata A on: 08/30/2013 05:18 PM   Modules accepted: Orders

## 2013-08-30 NOTE — Telephone Encounter (Signed)
Please call Daughter to let her know pt has been referred to Neurology. Also give her the information for [facility]- Marval Regallizabeth Vanderveer [phone] to help assist her with finding a supervised living facility for her mother.
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
## 2013-08-30 NOTE — Telephone Encounter (Signed)
Daughter Lura Ematsy would like to talk with Dr. Cleta Albertsaub regarding her mom being so irritated.  The paxil is not working and she is worse.   Needs something to calm her down.   [phone]

## 2013-09-01 ENCOUNTER — Telehealth: Payer: Self-pay

## 2013-09-01 NOTE — Telephone Encounter (Signed)
Gave [name] information about referral and [facility]. She stated she has checked into some respite care but needs to go visit and pt has appt w/endocrinologist on Friday. She will call [facility].

## 2013-09-01 NOTE — Telephone Encounter (Signed)
PATIENT NEEDS TB TEST AND CHEST X RAY. PATIENTS DAUGHTER SAYS IT NEEDS TO BE AN URGENT CHEST X RAY BECAUSE THE APPOINTMENT IS NEXT WEEK

## 2013-09-02 NOTE — Telephone Encounter (Signed)
I will be at 102 tomorrow from 10-7 and work Sunday 8-4 either day would be fine

## 2013-09-02 NOTE — Telephone Encounter (Signed)
Spoke to Apache CorporationPatsy- she will be bringing in patient for a TB test and chest Xray- these are required for Respa Care/ Assisted Living.

## 2013-09-02 NOTE — Telephone Encounter (Signed)
Advised pt of hours previously. She is bringing pt in tomorrow around 1130. Confirmed she would be in.

## 2013-09-03 ENCOUNTER — Ambulatory Visit (INDEPENDENT_AMBULATORY_CARE_PROVIDER_SITE_OTHER): Payer: Managed Care, Other (non HMO) | Admitting: Emergency Medicine

## 2013-09-03 ENCOUNTER — Ambulatory Visit (INDEPENDENT_AMBULATORY_CARE_PROVIDER_SITE_OTHER): Payer: Managed Care, Other (non HMO)

## 2013-09-03 VITALS — BP 112/68 | HR 67 | Temp 97.9°F | Resp 16

## 2013-09-03 DIAGNOSIS — Z111 Encounter for screening for respiratory tuberculosis: Secondary | ICD-10-CM

## 2013-09-03 NOTE — Progress Notes (Signed)
   Subjective:    Patient ID: [name], female    DOB: 11/04/1926, 80 y.o.   MRN: [id number]  HPI  Tuberculosis screening  Review of Systems     Objective:   Physical Exam   Tuberculosis Risk Questionnaire  1. No Were you born outside the [place] in one of the following parts of the world: [place], [place], [place], [place] or [place]?    2. No Have you traveled outside the [place] and lived for more than one month in one of the following parts of the world: [place], [place], [place], [place] or [place]?    3. No Do you have a compromised immune system such as from any of the following conditions:HIV/AIDS, organ or bone marrow transplantation, diabetes, immunosuppressive medicines (e.g. Prednisone, Remicaide), leukemia, lymphoma, cancer of the head or neck, gastrectomy or jejunal bypass, end-stage renal disease (on dialysis), or silicosis?     4. Yes (she is going to live/stay in a residential care center) Have you ever or do you plan on working in: a residential care center, a health care facility, a jail or prison or homeless shelter?    5. No Have you ever: injected illegal drugs, used crack cocaine, lived in a homeless shelter  or been in jail or prison?     6. No Have you ever been exposed to anyone with infectious tuberculosis?    Tuberculosis Symptom Questionnaire  Do you currently have any of the following symptoms?  1. No Unexplained cough lasting more than 3 weeks?   2. No Unexplained fever lasting more than 3 weeks.   3. No Night Sweats (sweating that leaves the bedclothes and sheets wet)     4. No Shortness of Breath   5. No Chest Pain   6. No Unintentional weight loss    7. No Unexplained fatigue (very tired for no reason)   [facility] reading (PRIMARY) by  Dr. Cleta Albertsaub there is cardiomegaly with a pacemaker p there is no evidence of lung disease       Assessment & Plan:  TB test and CXR Screening for tuberculosis When  patient returns for TB read in 48-72 hours we can give her (or her daughter) a copy of results of TB test and CXR.

## 2013-09-05 ENCOUNTER — Emergency Department (HOSPITAL_COMMUNITY)
Admission: EM | Admit: 2013-09-05 | Discharge: 2013-09-05 | Disposition: A | Discharge status: Home / Self Care | Payer: Medicare HMO | Attending: Emergency Medicine | Admitting: Emergency Medicine

## 2013-09-05 ENCOUNTER — Encounter: Payer: Self-pay | Admitting: Emergency Medicine

## 2013-09-05 ENCOUNTER — Emergency Department (HOSPITAL_COMMUNITY): Payer: Medicare HMO

## 2013-09-05 ENCOUNTER — Ambulatory Visit (INDEPENDENT_AMBULATORY_CARE_PROVIDER_SITE_OTHER): Payer: Managed Care, Other (non HMO) | Admitting: Emergency Medicine

## 2013-09-05 ENCOUNTER — Encounter (HOSPITAL_COMMUNITY): Payer: Self-pay | Admitting: Emergency Medicine

## 2013-09-05 VITALS — BP 92/56 | HR 60 | Temp 98.2°F

## 2013-09-05 DIAGNOSIS — R011 Cardiac murmur, unspecified: Secondary | ICD-10-CM | POA: Insufficient documentation

## 2013-09-05 DIAGNOSIS — I10 Essential (primary) hypertension: Secondary | ICD-10-CM | POA: Diagnosis not present

## 2013-09-05 DIAGNOSIS — F411 Generalized anxiety disorder: Secondary | ICD-10-CM | POA: Insufficient documentation

## 2013-09-05 DIAGNOSIS — Z79899 Other long term (current) drug therapy: Secondary | ICD-10-CM | POA: Diagnosis not present

## 2013-09-05 DIAGNOSIS — F03918 Unspecified dementia, unspecified severity, with other behavioral disturbance: Secondary | ICD-10-CM | POA: Diagnosis present

## 2013-09-05 DIAGNOSIS — IMO0002 Reserved for concepts with insufficient information to code with codable children: Secondary | ICD-10-CM

## 2013-09-05 DIAGNOSIS — E039 Hypothyroidism, unspecified: Secondary | ICD-10-CM | POA: Diagnosis not present

## 2013-09-05 DIAGNOSIS — F0391 Unspecified dementia with behavioral disturbance: Secondary | ICD-10-CM | POA: Diagnosis present

## 2013-09-05 DIAGNOSIS — Z950 Presence of cardiac pacemaker: Secondary | ICD-10-CM | POA: Diagnosis not present

## 2013-09-05 DIAGNOSIS — R451 Restlessness and agitation: Secondary | ICD-10-CM

## 2013-09-05 DIAGNOSIS — R4589 Other symptoms and signs involving emotional state: Secondary | ICD-10-CM

## 2013-09-05 LAB — COMPREHENSIVE METABOLIC PANEL
ALBUMIN: 3.8 g/dL (ref 3.5–5.2)
ALK PHOS: 53 U/L (ref 39–117)
ALT: 20 U/L (ref 0–35)
AST: 35 U/L (ref 0–37)
Anion gap: 14 (ref 5–15)
BILIRUBIN TOTAL: 0.5 mg/dL (ref 0.3–1.2)
BUN: 26 mg/dL — AB (ref 6–23)
CO2: 25 meq/L (ref 19–32)
Calcium: 9.2 mg/dL (ref 8.4–10.5)
Chloride: 101 mEq/L (ref 96–112)
Creatinine, Ser: 0.83 mg/dL (ref 0.50–1.10)
GFR, EST AFRICAN AMERICAN: 73 mL/min — AB (ref >90)
GFR, EST NON AFRICAN AMERICAN: 63 mL/min — AB (ref >90)
Glucose, Bld: 85 mg/dL (ref 70–99)
POTASSIUM: 4.2 meq/L (ref 3.7–5.3)
Sodium: 140 mEq/L (ref 137–147)
Total Protein: 7.3 g/dL (ref 6.0–8.3)

## 2013-09-05 LAB — URINALYSIS, ROUTINE W REFLEX MICROSCOPIC
Bilirubin Urine: NEGATIVE
GLUCOSE, UA: NEGATIVE mg/dL
HGB URINE DIPSTICK: NEGATIVE
Ketones, ur: NEGATIVE mg/dL
Leukocytes, UA: NEGATIVE
Nitrite: NEGATIVE
Protein, ur: NEGATIVE mg/dL
Specific Gravity, Urine: 1.01 (ref 1.005–1.030)
Urobilinogen, UA: 0.2 mg/dL (ref 0.0–1.0)
pH: 7 (ref 5.0–8.0)

## 2013-09-05 LAB — ACETAMINOPHEN LEVEL: Acetaminophen (Tylenol), Serum: <15 ug/mL (ref 10–30)

## 2013-09-05 LAB — TB SKIN TEST
Induration: 0 mm
TB Skin Test: NEGATIVE

## 2013-09-05 LAB — CBC
HEMATOCRIT: 37.7 % (ref 36.0–46.0)
HEMOGLOBIN: 12.4 g/dL (ref 12.0–15.0)
MCH: 32.8 pg (ref 26.0–34.0)
MCHC: 32.9 g/dL (ref 30.0–36.0)
MCV: 99.7 fL (ref 78.0–100.0)
Platelets: 166 10*3/uL (ref 150–400)
RBC: 3.78 MIL/uL — ABNORMAL LOW (ref 3.87–5.11)
RDW: 14.6 % (ref 11.5–15.5)
WBC: 6.6 10*3/uL (ref 4.0–10.5)

## 2013-09-05 LAB — SALICYLATE LEVEL: Salicylate Lvl: <2 mg/dL — ABNORMAL LOW (ref 2.8–20.0)

## 2013-09-05 LAB — GLUCOSE, POCT (MANUAL RESULT ENTRY): POC Glucose: 111 mg/dl — AB (ref 70–99)

## 2013-09-05 LAB — ETHANOL: Alcohol, Ethyl (B): <11 mg/dL (ref 0–11)

## 2013-09-05 MED ORDER — RISPERIDONE 0.5 MG PO TABS: ORAL_TABLET | ORAL | Status: DC

## 2013-09-05 MED ORDER — RISPERIDONE 0.5 MG PO TABS
0.5000 mg | ORAL_TABLET | Freq: Every day | ORAL | Status: DC
  Administered 2013-09-05: 0.5 mg via ORAL
  Filled 2013-09-05: qty 1
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
## 2013-09-05 NOTE — ED Notes (Signed)
Pt and pt's daughter aware of need for urine sample.

## 2013-09-05 NOTE — Progress Notes (Deleted)
   Subjective:    Patient ID: [name], female    DOB: 1926-11-07, 80 y.o.   MRN: [id number]  HPI    Review of Systems     Objective:   Physical Exam        Assessment & Plan:

## 2013-09-05 NOTE — Discharge Instructions (Signed)

## 2013-09-05 NOTE — ED Notes (Signed)
Patient transported to CT 

## 2013-09-11 ENCOUNTER — Emergency Department (HOSPITAL_COMMUNITY): Payer: Medicare HMO

## 2013-09-11 ENCOUNTER — Encounter (HOSPITAL_COMMUNITY): Payer: Self-pay | Admitting: Emergency Medicine

## 2013-09-11 ENCOUNTER — Emergency Department (HOSPITAL_COMMUNITY)
Admission: EM | Admit: 2013-09-11 | Discharge: 2013-09-11 | Disposition: A | Discharge status: Home / Self Care | Payer: Medicare HMO | Attending: Emergency Medicine | Admitting: Emergency Medicine

## 2013-09-11 DIAGNOSIS — I10 Essential (primary) hypertension: Secondary | ICD-10-CM | POA: Insufficient documentation

## 2013-09-11 DIAGNOSIS — I4891 Unspecified atrial fibrillation: Secondary | ICD-10-CM | POA: Diagnosis not present

## 2013-09-11 DIAGNOSIS — F0390 Unspecified dementia without behavioral disturbance: Secondary | ICD-10-CM | POA: Insufficient documentation

## 2013-09-11 DIAGNOSIS — E039 Hypothyroidism, unspecified: Secondary | ICD-10-CM | POA: Insufficient documentation

## 2013-09-11 DIAGNOSIS — R5383 Other fatigue: Secondary | ICD-10-CM

## 2013-09-11 DIAGNOSIS — F411 Generalized anxiety disorder: Secondary | ICD-10-CM | POA: Diagnosis not present

## 2013-09-11 DIAGNOSIS — R531 Weakness: Secondary | ICD-10-CM

## 2013-09-11 DIAGNOSIS — M25569 Pain in unspecified knee: Secondary | ICD-10-CM | POA: Insufficient documentation

## 2013-09-11 DIAGNOSIS — E785 Hyperlipidemia, unspecified: Secondary | ICD-10-CM | POA: Insufficient documentation

## 2013-09-11 DIAGNOSIS — R5381 Other malaise: Secondary | ICD-10-CM | POA: Diagnosis not present

## 2013-09-11 DIAGNOSIS — Z79899 Other long term (current) drug therapy: Secondary | ICD-10-CM | POA: Diagnosis not present

## 2013-09-11 DIAGNOSIS — I499 Cardiac arrhythmia, unspecified: Secondary | ICD-10-CM | POA: Diagnosis not present

## 2013-09-11 DIAGNOSIS — R011 Cardiac murmur, unspecified: Secondary | ICD-10-CM | POA: Diagnosis not present

## 2013-09-11 DIAGNOSIS — Z950 Presence of cardiac pacemaker: Secondary | ICD-10-CM | POA: Diagnosis not present

## 2013-09-11 DIAGNOSIS — E049 Nontoxic goiter, unspecified: Secondary | ICD-10-CM | POA: Insufficient documentation

## 2013-09-11 LAB — COMPREHENSIVE METABOLIC PANEL
ALBUMIN: 3.7 g/dL (ref 3.5–5.2)
ALT: 16 U/L (ref 0–35)
ANION GAP: 12 (ref 5–15)
AST: 27 U/L (ref 0–37)
Alkaline Phosphatase: 51 U/L (ref 39–117)
BUN: 29 mg/dL — AB (ref 6–23)
CALCIUM: 9.3 mg/dL (ref 8.4–10.5)
CHLORIDE: 103 meq/L (ref 96–112)
CO2: 26 meq/L (ref 19–32)
Creatinine, Ser: 0.89 mg/dL (ref 0.50–1.10)
GFR calc Af Amer: 67 mL/min — ABNORMAL LOW (ref >90)
GFR, EST NON AFRICAN AMERICAN: 58 mL/min — AB (ref >90)
Glucose, Bld: 122 mg/dL — ABNORMAL HIGH (ref 70–99)
Potassium: 3.9 mEq/L (ref 3.7–5.3)
Sodium: 141 mEq/L (ref 137–147)
Total Bilirubin: 0.6 mg/dL (ref 0.3–1.2)
Total Protein: 7.3 g/dL (ref 6.0–8.3)

## 2013-09-11 LAB — URINALYSIS, ROUTINE W REFLEX MICROSCOPIC
Bilirubin Urine: NEGATIVE
GLUCOSE, UA: NEGATIVE mg/dL
Hgb urine dipstick: NEGATIVE
Ketones, ur: NEGATIVE mg/dL
LEUKOCYTES UA: NEGATIVE
Nitrite: NEGATIVE
Protein, ur: NEGATIVE mg/dL
Specific Gravity, Urine: 1.029 (ref 1.005–1.030)
Urobilinogen, UA: 0.2 mg/dL (ref 0.0–1.0)
pH: 5 (ref 5.0–8.0)

## 2013-09-11 LAB — CBC
HEMATOCRIT: 37.3 % (ref 36.0–46.0)
Hemoglobin: 12.4 g/dL (ref 12.0–15.0)
MCH: 32.7 pg (ref 26.0–34.0)
MCHC: 33.2 g/dL (ref 30.0–36.0)
MCV: 98.4 fL (ref 78.0–100.0)
Platelets: 171 10*3/uL (ref 150–400)
RBC: 3.79 MIL/uL — ABNORMAL LOW (ref 3.87–5.11)
RDW: 14.7 % (ref 11.5–15.5)
WBC: 6.4 10*3/uL (ref 4.0–10.5)

## 2013-09-11 LAB — T4, FREE: Free T4: 1.21 ng/dL (ref 0.80–1.80)

## 2013-09-11 LAB — TROPONIN I: Troponin I: <0.3 ng/mL (ref <0.30)

## 2013-09-11 LAB — TSH: TSH: 18.24 u[IU]/mL — AB (ref 0.350–4.500)

## 2013-09-11 NOTE — Progress Notes (Signed)
CSW met with pt and pt's daughter per CM request.  Pt's daughter expressed overwhelm re: financial cost of providing care for her mother, as well as emotional toll of caretaking. CSW provided supportive counseling. CSW provided overview of respite options and assisted living. CSW provided education on different levels of assisted living. Pt is already slated to attend resp[facility] care at [facility] starting Monday for 3 days. CM has ordered home health.   Rochele Pages,     ED CSW  phone: [phone] 4:47pm

## 2013-09-11 NOTE — Progress Notes (Addendum)
CARE MANAGEMENT NOTE 09/12/2013  Patient:  [name],[name]   Account Number:  [id number]  Date Initiated:  09/12/2013  Documentation initiated by:  [facility],Aqil Goetting  Subjective/Objective Assessment:     Action/Plan:   Anticipated DC Date:  09/11/2013   Anticipated DC Plan:  HOME W HOME HEALTH SERVICES      DC Planning Services  CM consult      [facility]  [facility]ALTH   Choice offered to / List presented to:  C-4 Adult Children        HH arranged  HH-1 RN  HH-2 PT  HH-4 NURSE'S AIDE  HH-6 SOCIAL WORKER      HH agency  [facility]   Status of service:  Completed, signed off Medicare Important Message given?   (If response is "NO", the following Medicare IM given date fields will be blank) Date Medicare IM given:   Medicare IM given by:   Date Additional Medicare IM given:   Additional Medicare IM given by:    Discharge Disposition:  HOME W HOME HEALTH SERVICES  Per UR Regulation:    If discussed at Long Length of Stay Meetings, dates discussed:    Comments:  Elliot CousinAlesia Ellen Zan Orlick, RN Case Manager Addendum CASE MANAGEMENT Progress Notes Service date: 09/11/2013 6:51 PM NCM spoke to dtr, Patsy Cucinella and [facility] States she had [facility], # # [phone] in the past. Requesting [facility]. NCM [facility]. Dtr states she [facility] when she goes out of town for three days next week. Isidoro DonningAlesia Viveca Beckstrom RN CCM Case Mgmt phone [phone]

## 2013-09-11 NOTE — ED Notes (Signed)
Per EMS Pt fell Monday and has had progressively worsening pain since; per EMS pt was here 09/05/2013 as well and was treated for agitation, prescribed resperidone and has been more lethargic since; pt also fell 2 weeks ago and hit her head

## 2013-09-11 NOTE — ED Notes (Signed)
Bed: WA03 Expected date:  Expected time:  Means of arrival:  Comments: EMS fall/lethargy/knee pain

## 2013-09-11 NOTE — ED Notes (Signed)
Asked patient several times to ambulate down he hallway. Placed socks on her feet and I also got a walker for patient, she sat on the edge of her bed and stated she was not able to walk. I asked patient is she unable to walk because she is in pain she stated she is to tired. I notified doctor Stinel

## 2013-09-11 NOTE — Discharge Instructions (Signed)
Our case manager has helped to facilitate home health care services to begin this week, to include home health aid, home health nurse, PT and social work.  The home health social worker can help you explore extended care facility/dementia unit placement should you decide to pursue that in the future.   Also follow up with your primary care doctor in the coming week for recheck.  From today's labs, your thyroid test (TSH 18) remains high, but is markedly improved from prior - continue your current medication, and follow up with your doctor.  At follow up, have them review your latest thyroid tests.  Return to ER if worse, new symptoms, fevers, trouble breathing, persistent vomiting, new symptoms/pain, change in mental status, other concern.     Dementia Dementia is a general term for problems with brain function. A person with dementia has memory loss and a hard time with at least one other brain function such as thinking, speaking, or problem solving. Dementia can affect social functioning, how you do your job, your mood, or your personality. The changes may be hidden for a long time. The earliest forms of this disease are usually not detected by family or friends. Dementia can be:  Irreversible.  Potentially reversible.  Partially reversible.  Progressive. This means it can get worse over time. CAUSES  Irreversible dementia causes may include:  Degeneration of brain cells (Alzheimer disease or Lewy body dementia).  Multiple small strokes (vascular dementia).  Infection (chronic meningitis or Creutzfeldt-Jakob disease).  Frontotemporal dementia. This affects younger people, age 28 to 39, compared to those who have Alzheimer disease.  Dementia associated with other disorders like Parkinson disease, Huntington disease, or HIV-associated dementia. Potentially or partially reversible dementia causes may include:  Medicines.  Metabolic causes such as excessive alcohol intake, vitamin  B12 deficiency, or thyroid disease.  Masses or pressure in the brain such as a tumor, blood clot, or hydrocephalus. SIGNS AND SYMPTOMS  Symptoms are often hard to detect. Family members or coworkers may not notice them early in the disease process. Different people with dementia may have different symptoms. Symptoms can include:  A hard time with memory, especially recent memory. Long-term memory may not be impaired.  Asking the same question multiple times or forgetting something someone just said.  A hard time speaking your thoughts or finding certain words.  A hard time solving problems or performing familiar tasks (such as how to use a telephone).  Sudden changes in mood.  Changes in personality, especially increasing moodiness or mistrust.  Depression.  A hard time understanding complex ideas that were never a problem in the past. DIAGNOSIS  There are no specific tests for dementia.   Your health care provider may recommend a thorough evaluation. This is because some forms of dementia can be reversible. The evaluation will likely include a physical exam and getting a detailed history from you and a family member. The history often gives the best clues and suggestions for a diagnosis.  Memory testing may be done. A detailed brain function evaluation called neuropsychologic testing may be helpful.  Lab tests and brain imaging (such as a CT scan or MRI scan) are sometimes important.  Sometimes observation and re-evaluation over time is very helpful. TREATMENT  Treatment depends on the cause.   If the problem is a vitamin deficiency, it may be helped or cured with supplements.  For dementias such as Alzheimer disease, medicines are available to stabilize or slow the course of the disease. There are  no cures for this type of dementia.  Your health care provider can help direct you to groups, organizations, and other health care providers to help with decisions in the care of you  or your loved one. HOME CARE INSTRUCTIONS The care of individuals with dementia is varied and dependent upon the progression of the dementia. The following suggestions are intended for the person living with, or caring for, the person with dementia.  Create a safe environment.  Remove the locks on bathroom doors to prevent the person from accidentally locking himself or herself in.  Use childproof latches on kitchen cabinets and any place where cleaning supplies, chemicals, or alcohol are kept.  Use childproof covers in unused electrical outlets.  Install childproof devices to keep doors and windows secured.  Remove stove knobs or install safety knobs and an automatic shut-off on the stove.  Lower the temperature on water heaters.  Label medicines and keep them locked up.  Secure knives, lighters, matches, power tools, and guns, and keep these items out of reach.  Keep the house free from clutter. Remove rugs or anything that might contribute to a fall.  Remove objects that might break and hurt the person.  Make sure lighting is good, both inside and outside.  Install grab rails as needed.  Use a monitoring device to alert you to falls or other needs for help.  Reduce confusion.  Keep familiar objects and people around.  Use night lights or dim lights at night.  Label items or areas.  Use reminders, notes, or directions for daily activities or tasks.  Keep a simple, consistent routine for waking, meals, bathing, dressing, and bedtime.  Create a calm, quiet environment.  Place large clocks and calendars prominently.  Display emergency numbers and home address near all telephones.  Use cues to establish different times of the day. An example is to open curtains to let the natural light in during the day.   Use effective communication.  Choose simple words and short sentences.  Use a gentle, calm tone of voice.  Be careful not to interrupt.  If the person is  struggling to find a word or communicate a thought, try to provide the word or thought.  Ask one question at a time. Allow the person ample time to answer questions. Repeat the question again if the person does not respond.  Reduce nighttime restlessness.  Provide a comfortable bed.  Have a consistent nighttime routine.  Ensure a regular walking or physical activity schedule. Involve the person in daily activities as much as possible.  Limit napping during the day.  Limit caffeine.  Attend social events that stimulate rather than overwhelm the senses.  Encourage good nutrition and hydration.  Reduce distractions during meal times and snacks.  Avoid foods that are too hot or too cold.  Monitor chewing and swallowing ability.  Continue with routine vision, hearing, dental, and medical screenings.  Give medicines only as directed by the health care provider.  Monitor driving abilities. Do not allow the person to drive when safe driving is no longer possible.  Register with an identification program which could provide location assistance in the event of a missing person situation. SEEK MEDICAL CARE IF:   New behavioral problems start such as moodiness, aggressiveness, or seeing things that are not there (hallucinations).  Any new problem with brain function happens. This includes problems with balance, speech, or falling a lot.  Problems with swallowing develop.  Any symptoms of other illness happen.  Small changes or worsening in any aspect of brain function can be a sign that the illness is getting worse. It can also be a sign of another medical illness such as infection. Seeing a health care provider right away is important. SEEK IMMEDIATE MEDICAL CARE IF:   A fever develops.  New or worsened confusion develops.  New or worsened sleepiness develops.  Staying awake becomes hard to do. Document Released: 07/24/2000 Document Revised: 06/14/2013 Document Reviewed:  06/25/2010 [facility] Patient Information 2015 [place], [place]. This information is not intended to replace advice given to you by your health care provider. Make sure you discuss any questions you have with your health care provider.  Dementia Dementia is a general term for problems with brain function. A person with dementia has memory loss and a hard time with at least one other brain function such as thinking, speaking, or problem solving. Dementia can affect social functioning, how you do your job, your mood, or your personality. The changes may be hidden for a long time. The earliest forms of this disease are usually not detected by family or friends. Dementia can be:  Irreversible.  Potentially reversible.  Partially reversible.  Progressive. This means it can get worse over time. CAUSES  Irreversible dementia causes may include:  Degeneration of brain cells (Alzheimer disease or Lewy body dementia).  Multiple small strokes (vascular dementia).  Infection (chronic meningitis or Creutzfeldt-Jakob disease).  Frontotemporal dementia. This affects younger people, age 4 to 86, compared to those who have Alzheimer disease.  Dementia associated with other disorders like Parkinson disease, Huntington disease, or HIV-associated dementia. Potentially or partially reversible dementia causes may include:  Medicines.  Metabolic causes such as excessive alcohol intake, vitamin B12 deficiency, or thyroid disease.  Masses or pressure in the brain such as a tumor, blood clot, or hydrocephalus. SIGNS AND SYMPTOMS  Symptoms are often hard to detect. Family members or coworkers may not notice them early in the disease process. Different people with dementia may have different symptoms. Symptoms can include:  A hard time with memory, especially recent memory. Long-term memory may not be impaired.  Asking the same question multiple times or forgetting something someone just said.  A hard time  speaking your thoughts or finding certain words.  A hard time solving problems or performing familiar tasks (such as how to use a telephone).  Sudden changes in mood.  Changes in personality, especially increasing moodiness or mistrust.  Depression.  A hard time understanding complex ideas that were never a problem in the past. DIAGNOSIS  There are no specific tests for dementia.   Your health care provider may recommend a thorough evaluation. This is because some forms of dementia can be reversible. The evaluation will likely include a physical exam and getting a detailed history from you and a family member. The history often gives the best clues and suggestions for a diagnosis.  Memory testing may be done. A detailed brain function evaluation called neuropsychologic testing may be helpful.  Lab tests and brain imaging (such as a CT scan or MRI scan) are sometimes important.  Sometimes observation and re-evaluation over time is very helpful. TREATMENT  Treatment depends on the cause.   If the problem is a vitamin deficiency, it may be helped or cured with supplements.  For dementias such as Alzheimer disease, medicines are available to stabilize or slow the course of the disease. There are no cures for this type of dementia.  Your health care provider can help  direct you to groups, organizations, and other health care providers to help with decisions in the care of you or your loved one. HOME CARE INSTRUCTIONS The care of individuals with dementia is varied and dependent upon the progression of the dementia. The following suggestions are intended for the person living with, or caring for, the person with dementia.  Create a safe environment.  Remove the locks on bathroom doors to prevent the person from accidentally locking himself or herself in.  Use childproof latches on kitchen cabinets and any place where cleaning supplies, chemicals, or alcohol are kept.  Use childproof  covers in unused electrical outlets.  Install childproof devices to keep doors and windows secured.  Remove stove knobs or install safety knobs and an automatic shut-off on the stove.  Lower the temperature on water heaters.  Label medicines and keep them locked up.  Secure knives, lighters, matches, power tools, and guns, and keep these items out of reach.  Keep the house free from clutter. Remove rugs or anything that might contribute to a fall.  Remove objects that might break and hurt the person.  Make sure lighting is good, both inside and outside.  Install grab rails as needed.  Use a monitoring device to alert you to falls or other needs for help.  Reduce confusion.  Keep familiar objects and people around.  Use night lights or dim lights at night.  Label items or areas.  Use reminders, notes, or directions for daily activities or tasks.  Keep a simple, consistent routine for waking, meals, bathing, dressing, and bedtime.  Create a calm, quiet environment.  Place large clocks and calendars prominently.  Display emergency numbers and home address near all telephones.  Use cues to establish different times of the day. An example is to open curtains to let the natural light in during the day.   Use effective communication.  Choose simple words and short sentences.  Use a gentle, calm tone of voice.  Be careful not to interrupt.  If the person is struggling to find a word or communicate a thought, try to provide the word or thought.  Ask one question at a time. Allow the person ample time to answer questions. Repeat the question again if the person does not respond.  Reduce nighttime restlessness.  Provide a comfortable bed.  Have a consistent nighttime routine.  Ensure a regular walking or physical activity schedule. Involve the person in daily activities as much as possible.  Limit napping during the day.  Limit caffeine.  Attend social events  that stimulate rather than overwhelm the senses.  Encourage good nutrition and hydration.  Reduce distractions during meal times and snacks.  Avoid foods that are too hot or too cold.  Monitor chewing and swallowing ability.  Continue with routine vision, hearing, dental, and medical screenings.  Give medicines only as directed by the health care provider.  Monitor driving abilities. Do not allow the person to drive when safe driving is no longer possible.  Register with an identification program which could provide location assistance in the event of a missing person situation. SEEK MEDICAL CARE IF:   New behavioral problems start such as moodiness, aggressiveness, or seeing things that are not there (hallucinations).  Any new problem with brain function happens. This includes problems with balance, speech, or falling a lot.  Problems with swallowing develop.  Any symptoms of other illness happen. Small changes or worsening in any aspect of brain function can be a sign  that the illness is getting worse. It can also be a sign of another medical illness such as infection. Seeing a health care provider right away is important. SEEK IMMEDIATE MEDICAL CARE IF:   A fever develops.  New or worsened confusion develops.  New or worsened sleepiness develops.  Staying awake becomes hard to do. Document Released: 07/24/2000 Document Revised: 06/14/2013 Document Reviewed: 06/25/2010 [facility] Patient Information 2015 [place], [place]. This information is not intended to replace advice given to you by your health care provider. Make sure you discuss any questions you have with your health care provider.    Fall Prevention and Home Safety Falls cause injuries and can affect all age groups. It is possible to use preventive measures to significantly decrease the likelihood of falls. There are many simple measures which can make your home safer and prevent falls. OUTDOORS  Repair cracks and  edges of walkways and driveways.  Remove high doorway thresholds.  Trim shrubbery on the main path into your home.  Have good outside lighting.  Clear walkways of tools, rocks, debris, and clutter.  Check that handrails are not broken and are securely fastened. Both sides of steps should have handrails.  Have leaves, snow, and ice cleared regularly.  Use sand or salt on walkways during winter months.  In the garage, clean up grease or oil spills. BATHROOM  Install night lights.  Install grab bars by the toilet and in the tub and shower.  Use non-skid mats or decals in the tub or shower.  Place a plastic non-slip stool in the shower to sit on, if needed.  Keep floors dry and clean up all water on the floor immediately.  Remove soap buildup in the tub or shower on a regular basis.  Secure bath mats with non-slip, double-sided rug tape.  Remove throw rugs and tripping hazards from the floors. BEDROOMS  Install night lights.  Make sure a bedside light is easy to reach.  Do not use oversized bedding.  Keep a telephone by your bedside.  Have a firm chair with side arms to use for getting dressed.  Remove throw rugs and tripping hazards from the floor. KITCHEN  Keep handles on pots and pans turned toward the center of the stove. Use back burners when possible.  Clean up spills quickly and allow time for drying.  Avoid walking on wet floors.  Avoid hot utensils and knives.  Position shelves so they are not too high or low.  Place commonly used objects within easy reach.  If necessary, use a sturdy step stool with a grab bar when reaching.  Keep electrical cables out of the way.  Do not use floor polish or wax that makes floors slippery. If you must use wax, use non-skid floor wax.  Remove throw rugs and tripping hazards from the floor. STAIRWAYS  Never leave objects on stairs.  Place handrails on both sides of stairways and use them. Fix any loose  handrails. Make sure handrails on both sides of the stairways are as long as the stairs.  Check carpeting to make sure it is firmly attached along stairs. Make repairs to worn or loose carpet promptly.  Avoid placing throw rugs at the top or bottom of stairways, or properly secure the rug with carpet tape to prevent slippage. Get rid of throw rugs, if possible.  Have an electrician put in a light switch at the top and bottom of the stairs. OTHER FALL PREVENTION TIPS  Wear low-heel or rubber-soled shoes  that are supportive and fit well. Wear closed toe shoes.  When using a stepladder, make sure it is fully opened and both spreaders are firmly locked. Do not climb a closed stepladder.  Add color or contrast paint or tape to grab bars and handrails in your home. Place contrasting color strips on first and last steps.  Learn and use mobility aids as needed. Install an electrical emergency response system.  Turn on lights to avoid dark areas. Replace light bulbs that burn out immediately. Get light switches that glow.  Arrange furniture to create clear pathways. Keep furniture in the same place.  Firmly attach carpet with non-skid or double-sided tape.  Eliminate uneven floor surfaces.  Select a carpet pattern that does not visually hide the edge of steps.  Be aware of all pets. OTHER HOME SAFETY TIPS  Set the water temperature for 120 F (48.8 C).  Keep emergency numbers on or near the telephone.  Keep smoke detectors on every level of the home and near sleeping areas. Document Released: 01/18/2002 Document Revised: 07/30/2011 Document Reviewed: 04/19/2011 [facility] Patient Information 2015 [place], [place]. This information is not intended to replace advice given to you by your health care provider. Make sure you discuss any questions you have with your health care provider.

## 2013-09-11 NOTE — ED Notes (Signed)
Patient stated she  is unable to ambulate even  with assistance

## 2013-09-11 NOTE — ED Provider Notes (Signed)
CSN: [id number]     Arrival date & time 09/11/13  [id number] History   First MD Initiated Contact with Patient 09/11/13 0900     Chief Complaint  Patient presents with  . Fall  . Knee Pain     (Consider location/radiation/quality/duration/timing/severity/associated sxs/prior Treatment) The history is provided by the patient and a relative. The history is limited by the condition of the patient.  pt with hx dementia, daughter states this morning was generally weak, seemed to have more trouble getting up and out of bed.  Fell 1 week ago, was seen in ED and had ct head and labs done. No recurrent falls since. Has had normal appetite. No vomiting or diarrhea.  No cough or uri c/o. No headaches. Pt denies any pain. No neck or back pain. No chest pain. No abd pain. Pt denies extremity pain or injury. No dysuria or gu c/o. No recent change in meds, except had not been taking thyroid med regularly, as prescribed, a month ago, so dose increased and daughter states she gives/watches her takes meds now.   At pts baseline, is ambulatory w unsteady gait/fall risk, is supposed to use walker, but often does not. Baseline mental status is advanced dementia, very confused at baseline - level 5 caveat, limiting hx.     Past Medical History  Diagnosis Date  . Dementia     "very mild"  . Hypothyroidism   . Dyslipidemia   . Goiter   . HTN (hypertension)   . Anxiety   . Dementia   . Hyperlipidemia   . Goiter   . Gait disorder   . Dysrhythmia   . Atrial fibrillation     not on anticoagulation due to fall risk and dementia  . RBBB   . Second degree AV block, Mobitz type II 05/2008    s/p PPM  . Heart murmur     2D echo with mild AI/AS, mild MR, mild TR EF 70%, diastolid dysfunction echo 10/2006   Past Surgical History  Procedure Laterality Date  . Pacemaker insertion    . Insert / replace / remove pacemaker    . Throidectomy    . Abdominal hysterectomy     Family History  Problem Relation Age of Onset   . Cancer Brother   . Heart disease Son    History  Substance Use Topics  . Smoking status: Never Smoker   . Smokeless tobacco: Never Used  . Alcohol Use: No     Comment: 1-2 monthly   OB History   Grav Para Term Preterm Abortions TAB SAB Ect Mult Living                 Review of Systems  Unable to perform ROS: Dementia  level 5 caveat.       Allergies  Ivp dye and Shellfish allergy  Home Medications   Prior to Admission medications   Medication Sig Start Date End Date Taking? Authorizing Provider  amiodarone (PACERONE) 200 MG tablet Take 200 mg by mouth daily.  10/14/12   Historical Provider, MD  calcium carbonate (TUMS) 500 MG chewable tablet Chew 2 tablets by mouth daily.    Historical Provider, MD  donepezil (ARICEPT) 10 MG tablet Take 1 tablet (10 mg total) by mouth at bedtime. 08/12/13   Collene GobbleSteven A Daub, MD  furosemide (LASIX) 40 MG tablet Take 1 tablet (40 mg total) by mouth daily as needed. For fluid retention 08/12/13   Collene GobbleSteven A Daub, MD  levothyroxine (SYNTHROID,  LEVOTHROID) 100 MCG tablet Take 1 tablet (100 mcg total) by mouth daily. 08/13/13   Collene Gobble, MD  LORazepam (ATIVAN) 0.5 MG tablet Take 1 tablet (0.5 mg total) by mouth 2 (two) times daily as needed for anxiety. 09/22/12   Collene Gobble, MD  ondansetron (ZOFRAN) 4 MG tablet Take 1 tablet (4 mg total) by mouth every 8 (eight) hours as needed for nausea or vomiting. 05/13/13   Collene Gobble, MD  PARoxetine (PAXIL) 10 MG tablet Take 1 tablet (10 mg total) by mouth daily. 08/12/13   Collene Gobble, MD  potassium chloride SA (K-DUR,KLOR-CON) 20 MEQ tablet Take 1 tablet (20 mEq total) by mouth daily as needed. Takes when taking Lasix 08/12/13   Collene Gobble, MD  risperiDONE (RISPERDAL) 0.5 MG tablet 1 po q am, 2 po q pm 09/05/13   Rolland Porter, MD  Vitamin D, Ergocalciferol, (DRISDOL) 50000 UNITS CAPS capsule Take 1 capsule (50,000 Units total) by mouth every 7 (seven) days. monday 08/12/13   Collene Gobble, MD   BP 108/55   Pulse 51  Temp(Src) 98.8 F (37.1 C) (Oral)  Resp 16  SpO2 96% Physical Exam  Nursing note and vitals reviewed. Constitutional: She appears well-developed and well-nourished. No distress.  HENT:  Head: Atraumatic.  Nose: Nose normal.  Mouth/Throat: Oropharynx is clear and moist.  Eyes: Conjunctivae are normal. Pupils are equal, round, and reactive to light. No scleral icterus.  Neck: Normal range of motion. Neck supple. No tracheal deviation present. No thyromegaly present.  No bruit. No stiffness or rigidity.   Cardiovascular: Normal rate, regular rhythm, normal heart sounds and intact distal pulses.  Exam reveals no gallop and no friction rub.   No murmur heard. Pulmonary/Chest: Effort normal and breath sounds normal. No respiratory distress.  Abdominal: Soft. Normal appearance and bowel sounds are normal. She exhibits no distension and no mass. There is no tenderness. There is no rebound and no guarding.  Genitourinary:  No cva tenderness  Musculoskeletal: She exhibits no edema and no tenderness.  CTLS spine, non tender, aligned, no step off.  Good rom bil ext without pain or focal bony tenderness. Distal pulses palp.   Neurological: She is alert.  Awake and alert, responds briefly, generally yes/no. Confused, mental state c/w baseline per family. Moves bil ext purposefully w good strength.   Skin: Skin is warm and dry. No rash noted. She is not diaphoretic.  Psychiatric:  Alert, content appearing.     ED Course  Procedures (including critical care time) Labs Review  Results for orders placed during the hospital encounter of 09/11/13  CBC      Result Value Ref Range   WBC 6.4  4.0 - 10.5 K/uL   RBC 3.79 (*) 3.87 - 5.11 MIL/uL   Hemoglobin 12.4  12.0 - 15.0 g/dL   HCT 40.9  81.1 - 91.4 %   MCV 98.4  78.0 - 100.0 fL   MCH 32.7  26.0 - 34.0 pg   MCHC 33.2  30.0 - 36.0 g/dL   RDW 78.2  95.6 - 21.3 %   Platelets 171  150 - 400 K/uL  COMPREHENSIVE METABOLIC PANEL       Result Value Ref Range   Sodium 141  137 - 147 mEq/L   Potassium 3.9  3.7 - 5.3 mEq/L   Chloride 103  96 - 112 mEq/L   CO2 26  19 - 32 mEq/L   Glucose, Bld 122 (*) 70 -  99 mg/dL   BUN 29 (*) 6 - 23 mg/dL   Creatinine, Ser 7.82  0.50 - 1.10 mg/dL   Calcium 9.3  8.4 - 95.6 mg/dL   Total Protein 7.3  6.0 - 8.3 g/dL   Albumin 3.7  3.5 - 5.2 g/dL   AST 27  0 - 37 U/L   ALT 16  0 - 35 U/L   Alkaline Phosphatase 51  39 - 117 U/L   Total Bilirubin 0.6  0.3 - 1.2 mg/dL   GFR calc non Af Amer 58 (*) >90 mL/min   GFR calc Af Amer 67 (*) >90 mL/min   Anion gap 12  5 - 15  URINALYSIS, ROUTINE W REFLEX MICROSCOPIC      Result Value Ref Range   Color, Urine YELLOW  YELLOW   APPearance CLEAR  CLEAR   Specific Gravity, Urine 1.029  1.005 - 1.030   pH 5.0  5.0 - 8.0   Glucose, UA NEGATIVE  NEGATIVE mg/dL   Hgb urine dipstick NEGATIVE  NEGATIVE   Bilirubin Urine NEGATIVE  NEGATIVE   Ketones, ur NEGATIVE  NEGATIVE mg/dL   Protein, ur NEGATIVE  NEGATIVE mg/dL   Urobilinogen, UA 0.2  0.0 - 1.0 mg/dL   Nitrite NEGATIVE  NEGATIVE   Leukocytes, UA NEGATIVE  NEGATIVE  TSH      Result Value Ref Range   TSH 18.240 (*) 0.350 - 4.500 uIU/mL  TROPONIN I      Result Value Ref Range   Troponin I <0.30  <0.30 ng/mL   Dg Chest 2 View  09/03/2013   CLINICAL DATA:  Evaluate for tuberculosis.  EXAM: CHEST  2 VIEW  COMPARISON:  None.  FINDINGS: Mediastinum and hilar structures normal. Cardiomegaly. Cardiac pacer node lead tips in the right atrium and right ventricle. No pleural effusion or pneumothorax. Degenerative changes thoracic spine.  IMPRESSION: 1. Cardiomegaly. Cardiac pacer noted with lead tips in the right atrium and right ventricle. No CHF. 2. No acute or active pulmonary disease.  No evidence of TB.   Electronically Signed   By: Maisie Fus  Register   On: 09/03/2013 15:38   Dg Lumbar Spine Complete  09/11/2013   CLINICAL DATA:  Right hip pain following a fall and right leg weakness.  EXAM: LUMBAR SPINE -  COMPLETE 4+ VIEW  COMPARISON:  None.  FINDINGS: Five non-rib-bearing lumbar vertebrae. Extensive lumbar and lower thoracic spine degenerative changes and mild scoliosis. Straightening of the normal lumbar lordosis. No fractures, pars defects or subluxations are seen. Atheromatous arterial calcifications.  IMPRESSION: 1. No fracture or subluxation. 2. Extensive degenerative changes.   Electronically Signed   By: Gordan Payment M.D.   On: 09/11/2013 14:47   Dg Hip Bilateral W/pelvis  09/11/2013   CLINICAL DATA:  Right hip pain and right leg weakness following a fall.  EXAM: BILATERAL HIP WITH PELVIS - 4+ VIEW  COMPARISON:  None.  FINDINGS: Mild to moderate bilateral femoral head and neck junction spur formation. No fracture or dislocation seen. Lower lumbar spine degenerative changes. Atheromatous arterial calcifications.  IMPRESSION: No fracture or dislocation. Mild to moderate bilateral hip degenerative changes.   Electronically Signed   By: Gordan Payment M.D.   On: 09/11/2013 14:49   Ct Head Wo Contrast  09/05/2013   CLINICAL DATA:  Dementia.  Aggression.  EXAM: CT HEAD WITHOUT CONTRAST  TECHNIQUE: Contiguous axial images were obtained from the base of the skull through the vertex without intravenous contrast.  COMPARISON:  Head CT  05/08/2013.  FINDINGS: Mild cerebral atrophy. Patchy and confluent areas of decreased attenuation are noted throughout the deep and periventricular white matter of the cerebral hemispheres bilaterally, compatible with chronic microvascular ischemic disease. No acute intracranial abnormalities. Specifically, no evidence of acute intracranial hemorrhage, no definite findings of acute/subacute cerebral ischemia, no mass, mass effect, hydrocephalus or abnormal intra or extra-axial fluid collections. Visualized paranasal sinuses and mastoids are well pneumatized, with exception of a small air-fluid level in the posterior aspect of the left maxillary sinus. No acute displaced skull fractures  are identified.  IMPRESSION: 1. No acute intracranial abnormalities. 2. Mild cerebral atrophy with extensive chronic microvascular ischemic changes throughout the cerebral white matter, as above. 3. Small air-fluid level in the left maxillary sinus. Clinical correlation to exclude signs and symptoms of acute sinusitis is suggested.   Electronically Signed   By: Trudie Reed M.D.   On: 09/05/2013 17:57        EKG Interpretation   Date/Time:  Saturday September 11 2013 10:11:15 EDT Ventricular Rate:  77 PR Interval:  299 QRS Duration: 138 QT Interval:  440 QTC Calculation: 498 R Axis:   -76 Text Interpretation:  Atrial-paced complexes RBBB and LAFB Premature  ventricular complexes Confirmed by Denton Lank  MD, Caryn Bee ([id number]) on 09/11/2013  11:08:14 AM      MDM   Labs. Cxr.  Reviewed nursing notes and prior charts for additional history.   Family requests xray ? Hip pain.   xrays neg acute.  Pt w good rom bil ext without pain or focal bony tenderness. Spine nt.   Pt w advanced dementia, and current mental status c/w baseline.  Recent head ct neg acute. todays labs unremarkable for reflecting acute process - tsh is elevated, although markedly improved from prior, and pt is back to getting her normal meds/doses.   Discussed w Case Manager as family expresses concern for pt/mobility at home, etc - they will set up home health aide, nurse, PT, and home social work re possible future placement.  Current no indication found for admission, and pts appears currently medically stable for d/c to home.    Suzi Roots, MD 09/11/13 [id number]

## 2013-09-14 ENCOUNTER — Other Ambulatory Visit: Payer: Self-pay | Admitting: *Deleted

## 2013-09-14 ENCOUNTER — Encounter: Payer: Self-pay | Admitting: Internal Medicine

## 2013-09-14 MED ORDER — LORAZEPAM 0.5 MG PO TABS: ORAL_TABLET | ORAL | Status: AC

## 2013-09-14 NOTE — Telephone Encounter (Signed)
[facility] 

## 2013-09-14 NOTE — Progress Notes (Signed)
This encounter was created in error - please disregard.

## 2013-09-20 ENCOUNTER — Telehealth: Payer: Self-pay

## 2013-09-20 NOTE — Telephone Encounter (Signed)
Verified Medication list with Home Health Nurse.

## 2013-09-20 NOTE — Telephone Encounter (Signed)
Patients home health nurse needs updated medication list. She says she has conflicting information that she needs to verify

## 2013-09-30 ENCOUNTER — Telehealth: Payer: Self-pay

## 2013-09-30 NOTE — Telephone Encounter (Signed)
Spoke to [employer] [facility]- They do not have a nurse to go out to the house this week to teach daughter care skills. They will try to get out there early as possible next week.

## 2013-09-30 NOTE — Telephone Encounter (Signed)
[facility] to get verbal orders to stop patients skilled nursing visits. Per Patty they are short on nurses this week and would like to restart Madilynn's back next week. Please call Patty at [phone]

## 2013-10-06 ENCOUNTER — Telehealth: Payer: Self-pay | Admitting: Emergency Medicine

## 2013-10-06 ENCOUNTER — Telehealth: Payer: Self-pay | Admitting: *Deleted

## 2013-10-12 NOTE — Telephone Encounter (Signed)
I received a phone call from EMS patient was found in cardiorespiratory rate arrest and asystole at approximately 620 this AM. they performed CPR without success. Apparently patient had complained of headache the day previously. She was found in a fetal position next to her bed when EMS arrived . She had a history of frequent falling. She had had recent deterioration in her dementia. I agreed to sign the death certificate.

## 2013-10-12 NOTE — Telephone Encounter (Signed)
Dr. Cleta Alberts received a call this morning from EMS. Pt was found unresponsive next to her bed. Pt passed away sometime last evening. Dr. Cleta Alberts agreed to sign the death certificate.

## 2013-10-12 DEATH — deceased

## 2013-10-20 ENCOUNTER — Institutional Professional Consult (permissible substitution): Payer: Managed Care, Other (non HMO) | Admitting: Neurology

## 2016-07-24 NOTE — Telephone Encounter (Signed)
error
